# Patient Record
Sex: Female | Born: 1985
Health system: Southern US, Community
[De-identification: ages and names within clinical notes are randomized; demographics above are authoritative.]

## PROBLEM LIST (undated history)

## (undated) DIAGNOSIS — K219 Gastro-esophageal reflux disease without esophagitis: Secondary | ICD-10-CM

## (undated) DIAGNOSIS — I10 Essential (primary) hypertension: Secondary | ICD-10-CM

## (undated) DIAGNOSIS — F419 Anxiety disorder, unspecified: Secondary | ICD-10-CM

---
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
## 2014-04-03 ENCOUNTER — Ambulatory Visit (INDEPENDENT_AMBULATORY_CARE_PROVIDER_SITE_OTHER): Payer: BC Managed Care – PPO | Admitting: Family

## 2014-04-03 ENCOUNTER — Encounter: Payer: Self-pay | Admitting: Family

## 2014-04-03 VITALS — BP 120/80 | HR 71 | Temp 98.8°F | Resp 16 | Ht 63.5 in | Wt 182.0 lb

## 2014-04-03 DIAGNOSIS — Z23 Encounter for immunization: Secondary | ICD-10-CM

## 2014-04-03 DIAGNOSIS — Z309 Encounter for contraceptive management, unspecified: Secondary | ICD-10-CM | POA: Insufficient documentation

## 2014-04-03 DIAGNOSIS — Z0000 Encounter for general adult medical examination without abnormal findings: Secondary | ICD-10-CM | POA: Insufficient documentation

## 2014-04-03 DIAGNOSIS — Z808 Family history of malignant neoplasm of other organs or systems: Secondary | ICD-10-CM

## 2014-04-03 LAB — POCT URINE HCG BY VISUAL COLOR COMPARISON TESTS: Preg Test, Ur: NEGATIVE

## 2014-04-03 MED ORDER — NORETHIN ACE-ETH ESTRAD-FE 1.5-30 MG-MCG PO TABS: 1.0000 | ORAL_TABLET | Freq: Every day | ORAL | Status: DC

## 2014-04-03 NOTE — Assessment & Plan Note (Signed)
Start microgestin, counseled pt on safe sex.

## 2014-04-03 NOTE — Patient Instructions (Signed)
Please complete lab work prior to leaving. Schedule pap smear at the front desk.

## 2014-04-03 NOTE — Progress Notes (Signed)
Subjective:    Patient ID: Gina Mcdowell, female    DOB: [birth year], 28 y.o.   MRN: [id number]  HPI [name] is a 28 year old female here to establish care today. LMP Mar 07, 2014  Immunizations: reports last tetanus was 2014 Diet:reports healthy diet Exercise: enjoys treadmill Pap Smear: 08/2012    Review of Systems  Constitutional: Negative for fever, chills, diaphoresis, appetite change, fatigue and unexpected weight change.       Good appetite. Eating more fruits and vegetables than meat lately. Body mass index is 31.73 kg/(m^2).   Respiratory: Negative for cough and shortness of breath.   Cardiovascular: Negative for chest pain, palpitations and leg swelling.  Gastrointestinal: Negative for diarrhea, constipation and blood in stool.  Musculoskeletal: Negative for myalgias.  Neurological: Positive for headaches. Negative for dizziness and seizures.       Has headaches 4 days out of a week. She believes this is related to her daily computer work. This has improved since she started wearing glasses. Takes asa which relieves the headache.  Hematological: Bruises/bleeds easily.  Psychiatric/Behavioral:       Denies depression or anxiety.   History reviewed. No pertinent past medical history.  History   Social History  . Marital Status: Single    Spouse Name: N/A    Number of Children: N/A  . Years of Education: N/A   Occupational History  . Not on file.   Social History Main Topics  . Smoking status: Never Smoker   . Smokeless tobacco: Never Used  . Alcohol Use: Yes     Comment: occasional  . Drug Use: No  . Sexual Activity: Yes    Birth Control/ Protection: Condom   Other Topics Concern  . Not on file   Social History Narrative   Works as Recruitment consultant at [employer], part time [employer]   Single   No children   No pets   Likes sleeping,speding time with family   She has 2 brothers and 2 sisters- all local.   Associates in medical billing/coding     History reviewed. No pertinent past surgical history.  Family History  Problem Relation Age of Onset  . Hypertension Mother   . Hyperlipidemia Father   . Hypertension Maternal Grandmother   . Diabetes Paternal Grandmother   . Cancer Paternal Grandmother     skin cancer melanoma  . Cancer Paternal Grandfather     prostate  . Cancer Other     colon and breast    No Known Allergies  No current outpatient prescriptions on file prior to visit.   No current facility-administered medications on file prior to visit.    BP 120/80  Pulse 71  Temp(Src) 98.8 F (37.1 C) (Oral)  Resp 16  Ht 5' 3.5" (1.613 m)  Wt 182 lb (82.555 kg)  BMI 31.73 kg/m2  SpO2 99%  LMP 03/07/2014       Objective:   Physical Exam  Constitutional: She is oriented to person, place, and time. She appears well-developed and well-nourished. No distress.  HENT:  Head: Normocephalic and atraumatic.  Mouth/Throat: No oropharyngeal exudate.  Eyes: Pupils are equal, round, and reactive to light. Right eye exhibits no discharge. Left eye exhibits no discharge. No scleral icterus.  Neck: Neck supple. No thyromegaly present.  Cardiovascular: Normal rate, regular rhythm, normal heart sounds and intact distal pulses.   No murmur heard. Pulmonary/Chest: Effort normal and breath sounds normal. No respiratory distress.  Abdominal: Soft.  Bowel sounds are normal. She exhibits no distension and no mass. There is no tenderness.  Musculoskeletal: She exhibits no edema.  Lymphadenopathy:    She has no cervical adenopathy.  Neurological: She is alert and oriented to person, place, and time.  Skin: Skin is warm and dry. She is not diaphoretic.  Psychiatric: She has a normal mood and affect. Her behavior is normal. Judgment and thought content normal.  Breast/pelvic deferred.         Assessment & Plan:  Patient seen by Adah Salvageawn Whitmire NP-student.  I have personally seen and examined patient and agree with Ms.  Whitmire's assessment and plan.

## 2014-04-03 NOTE — Assessment & Plan Note (Signed)
Discussed healthy diet, exercise, weight loss.  Obtain fasting labs.  Pt will return for pap/breast exam.

## 2014-04-03 NOTE — Progress Notes (Signed)
Pre visit review using our clinic review tool, if applicable. No additional management support is needed unless otherwise documented below in the visit note. 

## 2014-04-07 ENCOUNTER — Telehealth: Payer: Self-pay | Admitting: Family

## 2014-04-07 DIAGNOSIS — R809 Proteinuria, unspecified: Secondary | ICD-10-CM | POA: Insufficient documentation

## 2014-04-07 LAB — HEPATIC FUNCTION PANEL
ALK PHOS: 56 U/L (ref 39–117)
ALT: <8 U/L (ref 0–35)
AST: 14 U/L (ref 0–37)
Albumin: 3.7 g/dL (ref 3.5–5.2)
BILIRUBIN DIRECT: 0.1 mg/dL (ref 0.0–0.3)
BILIRUBIN INDIRECT: 0.1 mg/dL — AB (ref 0.2–1.2)
BILIRUBIN TOTAL: 0.2 mg/dL (ref 0.2–1.2)
Total Protein: 6.5 g/dL (ref 6.0–8.3)

## 2014-04-07 LAB — URINALYSIS, ROUTINE W REFLEX MICROSCOPIC
BILIRUBIN URINE: NEGATIVE
Glucose, UA: NEGATIVE mg/dL
KETONES UR: NEGATIVE mg/dL
Leukocytes, UA: NEGATIVE
NITRITE: NEGATIVE
PROTEIN: 30 mg/dL — AB
Specific Gravity, Urine: 1.03 (ref 1.005–1.030)
Urobilinogen, UA: 1 mg/dL (ref 0.0–1.0)
pH: 6 (ref 5.0–8.0)

## 2014-04-07 LAB — BASIC METABOLIC PANEL WITH GFR
BUN: 12 mg/dL (ref 6–23)
CHLORIDE: 103 meq/L (ref 96–112)
CO2: 26 meq/L (ref 19–32)
Calcium: 9 mg/dL (ref 8.4–10.5)
Creat: 0.81 mg/dL (ref 0.50–1.10)
GFR, Est African American: >89 mL/min
Glucose, Bld: 81 mg/dL (ref 70–99)
POTASSIUM: 4 meq/L (ref 3.5–5.3)
SODIUM: 137 meq/L (ref 135–145)

## 2014-04-07 LAB — CBC WITH DIFFERENTIAL/PLATELET
BASOS PCT: 0 % (ref 0–1)
Basophils Absolute: 0 10*3/uL (ref 0.0–0.1)
EOS PCT: 2 % (ref 0–5)
Eosinophils Absolute: 0.2 10*3/uL (ref 0.0–0.7)
HEMATOCRIT: 40.2 % (ref 36.0–46.0)
Hemoglobin: 13.5 g/dL (ref 12.0–15.0)
LYMPHS PCT: 48 % — AB (ref 12–46)
Lymphs Abs: 4.7 10*3/uL — ABNORMAL HIGH (ref 0.7–4.0)
MCH: 27.3 pg (ref 26.0–34.0)
MCHC: 33.6 g/dL (ref 30.0–36.0)
MCV: 81.4 fL (ref 78.0–100.0)
MONO ABS: 1.1 10*3/uL — AB (ref 0.1–1.0)
Monocytes Relative: 11 % (ref 3–12)
NEUTROS ABS: 3.8 10*3/uL (ref 1.7–7.7)
Neutrophils Relative %: 39 % — ABNORMAL LOW (ref 43–77)
PLATELETS: 341 10*3/uL (ref 150–400)
RBC: 4.94 MIL/uL (ref 3.87–5.11)
RDW: 14.2 % (ref 11.5–15.5)
WBC: 9.7 10*3/uL (ref 4.0–10.5)

## 2014-04-07 LAB — URINALYSIS, MICROSCOPIC ONLY
BACTERIA UA: NONE SEEN
Casts: NONE SEEN
Crystals: NONE SEEN

## 2014-04-07 LAB — LIPID PANEL
Cholesterol: 143 mg/dL (ref 0–200)
HDL: 38 mg/dL — AB (ref >39)
LDL Cholesterol: 83 mg/dL (ref 0–99)
Total CHOL/HDL Ratio: 3.8 Ratio
Triglycerides: 109 mg/dL (ref <150)
VLDL: 22 mg/dL (ref 0–40)

## 2014-04-07 LAB — TSH: TSH: 1.55 u[IU]/mL (ref 0.350–4.500)

## 2014-04-07 NOTE — Telephone Encounter (Signed)
Please contact pt and let her know that her urine has some protein in it.  I would like her to return to the lab in 2 weeks to repeat urinalysis. Blood count, liver, sugar, cholesterol all good. Except HDL "good cholesterol" could be higher.  Exercise can help raise HDL.

## 2014-04-07 NOTE — Telephone Encounter (Signed)
Notified pt and she voices understanding. Lab order entered. 

## 2014-04-29 LAB — URINALYSIS, ROUTINE W REFLEX MICROSCOPIC
Bilirubin Urine: NEGATIVE
Glucose, UA: NEGATIVE mg/dL
Hgb urine dipstick: NEGATIVE
Ketones, ur: NEGATIVE mg/dL
Leukocytes, UA: NEGATIVE
NITRITE: NEGATIVE
PROTEIN: NEGATIVE mg/dL
SPECIFIC GRAVITY, URINE: 1.014 (ref 1.005–1.030)
UROBILINOGEN UA: 1 mg/dL (ref 0.0–1.0)
pH: 6.5 (ref 5.0–8.0)

## 2014-06-12 ENCOUNTER — Other Ambulatory Visit (HOSPITAL_COMMUNITY)
Admission: RE | Admit: 2014-06-12 | Discharge: 2014-06-12 | Disposition: A | Discharge status: Home / Self Care | Payer: BC Managed Care – PPO | Source: Ambulatory Visit | Attending: Family | Admitting: Family

## 2014-06-12 ENCOUNTER — Encounter: Payer: Self-pay | Admitting: Family

## 2014-06-12 ENCOUNTER — Ambulatory Visit (INDEPENDENT_AMBULATORY_CARE_PROVIDER_SITE_OTHER): Payer: BC Managed Care – PPO | Admitting: Family

## 2014-06-12 VITALS — BP 122/86 | HR 65 | Temp 98.4°F | Resp 16 | Ht 63.5 in | Wt 179.0 lb

## 2014-06-12 DIAGNOSIS — Z01419 Encounter for gynecological examination (general) (routine) without abnormal findings: Secondary | ICD-10-CM

## 2014-06-12 DIAGNOSIS — Z309 Encounter for contraceptive management, unspecified: Secondary | ICD-10-CM

## 2014-06-12 DIAGNOSIS — N760 Acute vaginitis: Secondary | ICD-10-CM | POA: Insufficient documentation

## 2014-06-12 DIAGNOSIS — Z1151 Encounter for screening for human papillomavirus (HPV): Secondary | ICD-10-CM | POA: Diagnosis present

## 2014-06-12 DIAGNOSIS — Z113 Encounter for screening for infections with a predominantly sexual mode of transmission: Secondary | ICD-10-CM | POA: Insufficient documentation

## 2014-06-12 NOTE — Patient Instructions (Addendum)
We will contact you with your pap smear results. Follow up in 1 year.

## 2014-06-12 NOTE — Progress Notes (Signed)
Subjective:    Patient ID: Gina Mcdowell, female    DOB: [birth year], 28 y.o.   MRN: [id number]  HPI  Ms. Gina Mcdowell is a 28 yr old female who presents today for pap smear.  Patient's last menstrual period was:  2 weeks ago.  Sexually active: 1 partner last 6 months The current method of family planning is: ocp- recently started ocp. Notes that she has had some intermittent spotting.  Exercising:  Running steps regularly Smoker: non-smoker Pap: 2013, normal History of abnormal Pap: abnormal pap 2011 had biopsy  Review of Systems See HPI  No past medical history on file.  History   Social History  . Marital Status: Single    Spouse Name: N/A    Number of Children: N/A  . Years of Education: N/A   Occupational History  . Not on file.   Social History Main Topics  . Smoking status: Never Smoker   . Smokeless tobacco: Never Used  . Alcohol Use: Yes     Comment: occasional  . Drug Use: No  . Sexual Activity: Yes    Birth Control/ Protection: Condom   Other Topics Concern  . Not on file   Social History Narrative   Works as Recruitment consultant at [employer], part time [employer]   Single   No children   No pets   Likes sleeping,speding time with family   She has 2 brothers and 2 sisters- all local.   Associates in medical billing/coding    No past surgical history on file.  Family History  Problem Relation Age of Onset  . Hypertension Mother   . Hyperlipidemia Father   . Hypertension Maternal Grandmother   . Diabetes Paternal Grandmother   . Cancer Paternal Grandmother     skin cancer melanoma  . Cancer Paternal Grandfather     prostate  . Cancer Other     colon and breast    No Known Allergies  Current Outpatient Prescriptions on File Prior to Visit  Medication Sig Dispense Refill  . norethindrone-ethinyl estradiol-iron (MICROGESTIN FE 1.5/30) 1.5-30 MG-MCG tablet Take 1 tablet by mouth daily.  1 Package  5   No current facility-administered  medications on file prior to visit.    BP 122/86  Pulse 65  Temp(Src) 98.4 F (36.9 C) (Oral)  Resp 16  Ht 5' 3.5" (1.613 m)  Wt 179 lb (81.194 kg)  BMI 31.21 kg/m2  SpO2 98%  LMP 06/05/2014       Objective:   Physical Exam  Constitutional: She is oriented to person, place, and time. She appears well-developed and well-nourished. No distress.  Cardiovascular: Normal rate and regular rhythm.   No murmur heard. Pulmonary/Chest: Effort normal and breath sounds normal. No respiratory distress. She has no wheezes. She has no rales. She exhibits no tenderness.  Neurological: She is alert and oriented to person, place, and time.  Skin: Skin is warm and dry.  Psychiatric: She has a normal mood and affect. Her behavior is normal. Judgment and thought content normal.  Breasts: Examined lying and sitting.  Right: Without masses, retractions, discharge or axillary adenopathy.  Left: Without masses, retractions, discharge or axillary adenopathy.  Inguinal/mons: Normal without inguinal adenopathy  External genitalia: Normal  BUS/Urethra/Skene's glands: Normal  Bladder: Normal  Vagina: some thick white vaginal discharge is noted. Cervix: Normal  Uterus: normal in size, shape and contour. Midline and mobile  Adnexa/parametria:  Rt: Without masses or tenderness.  Lt: Without masses or tenderness.  Anus and perineum: Normal          Assessment & Plan:

## 2014-06-12 NOTE — Progress Notes (Signed)
Pre visit review using our clinic review tool, if applicable. No additional management support is needed unless otherwise documented below in the visit note. 

## 2014-06-14 ENCOUNTER — Telehealth: Payer: Self-pay | Admitting: Family

## 2014-06-14 LAB — CYTOLOGY - PAP

## 2014-06-14 MED ORDER — FLUCONAZOLE 150 MG PO TABS: ORAL_TABLET | ORAL | Status: DC

## 2014-06-14 NOTE — Telephone Encounter (Signed)
Please contact pt and let her know pap is negative.  Gonorrhea/chlamydia and HPV testing negative.  Did note yeast infection. rx sent for diflucan.

## 2014-06-15 DIAGNOSIS — Z01419 Encounter for gynecological examination (general) (routine) without abnormal findings: Secondary | ICD-10-CM | POA: Insufficient documentation

## 2014-06-15 NOTE — Telephone Encounter (Signed)
Spoke with pt, advised lab results. Pt understood. 

## 2014-06-15 NOTE — Assessment & Plan Note (Signed)
Continue ocp, discussed that in time, spotting should resolve, but to let me know if not improved in 3 months.

## 2014-06-15 NOTE — Assessment & Plan Note (Signed)
Pap performed. Discussed breast self exam.

## 2014-09-14 ENCOUNTER — Telehealth: Payer: Self-pay | Admitting: *Deleted

## 2014-09-14 MED ORDER — NORETHIN ACE-ETH ESTRAD-FE 1.5-30 MG-MCG PO TABS: 1.0000 | ORAL_TABLET | Freq: Every day | ORAL | Status: DC

## 2014-09-14 NOTE — Telephone Encounter (Signed)
Received fax from [facility] for Gildess FE 1.5/30, 1 tablet daily. Pt due for annual exam 06/2014.  Refills sent.

## 2014-09-15 ENCOUNTER — Other Ambulatory Visit: Payer: Self-pay | Admitting: Family

## 2014-09-15 NOTE — Telephone Encounter (Signed)
Received refill request from [facility] for microgestin. Refill sent yesterday to [facility]. Verified with pt today that she has enough to get her through until mail order arrives and if not she will let me know. Rx denied at [facility].

## 2014-12-08 ENCOUNTER — Telehealth: Payer: Self-pay | Admitting: *Deleted

## 2014-12-08 MED ORDER — NORETHIN ACE-ETH ESTRAD-FE 1.5-30 MG-MCG PO TABS: 1.0000 | ORAL_TABLET | Freq: Every day | ORAL | Status: DC

## 2014-12-08 NOTE — Telephone Encounter (Signed)
Received [facility] failure notification as "med not found". Spoke with pharmacy, pt is requesting refill of birth control to local pharmacy. Spoke with pt, she is requesting 30 day supply until she can order via [facility] mail order. 30 day supply sent to [facility]; pt aware.

## 2015-03-16 ENCOUNTER — Telehealth: Payer: Self-pay | Admitting: *Deleted

## 2015-03-16 MED ORDER — NORETHIN ACE-ETH ESTRAD-FE 1.5-30 MG-MCG PO TABS: 1.0000 | ORAL_TABLET | Freq: Every day | ORAL | Status: DC

## 2015-03-16 NOTE — Telephone Encounter (Signed)
Received refill request from [facility]. Refills sent. Pt due for CPE 06/13/15 or after.

## 2015-09-30 ENCOUNTER — Other Ambulatory Visit: Payer: Self-pay | Admitting: Family

## 2015-10-02 NOTE — Telephone Encounter (Signed)
Birth control refill sent to pharmacy. Pt is past due for fasting physical. Please call pt to arrange CPE with Melissa soon.  Thanks!

## 2015-11-23 ENCOUNTER — Encounter: Payer: Self-pay | Admitting: Family

## 2015-11-23 ENCOUNTER — Ambulatory Visit (INDEPENDENT_AMBULATORY_CARE_PROVIDER_SITE_OTHER): Payer: BLUE CROSS/BLUE SHIELD | Admitting: Family

## 2015-11-23 VITALS — BP 119/79 | HR 77 | Temp 98.6°F | Resp 16 | Ht 64.0 in | Wt 172.2 lb

## 2015-11-23 DIAGNOSIS — Z0000 Encounter for general adult medical examination without abnormal findings: Secondary | ICD-10-CM | POA: Diagnosis not present

## 2015-11-23 LAB — URINALYSIS, ROUTINE W REFLEX MICROSCOPIC
Bilirubin Urine: NEGATIVE
HGB URINE DIPSTICK: NEGATIVE
Ketones, ur: NEGATIVE
LEUKOCYTES UA: NEGATIVE
NITRITE: NEGATIVE
SPECIFIC GRAVITY, URINE: 1.015 (ref 1.000–1.030)
TOTAL PROTEIN, URINE-UPE24: NEGATIVE
URINE GLUCOSE: NEGATIVE
UROBILINOGEN UA: 0.2 (ref 0.0–1.0)
pH: 6.5 (ref 5.0–8.0)

## 2015-11-23 LAB — CBC WITH DIFFERENTIAL/PLATELET
Basophils Absolute: 0 10*3/uL (ref 0.0–0.1)
Basophils Relative: 0.5 % (ref 0.0–3.0)
EOS PCT: 1.6 % (ref 0.0–5.0)
Eosinophils Absolute: 0.1 10*3/uL (ref 0.0–0.7)
HCT: 41.7 % (ref 36.0–46.0)
Hemoglobin: 13.7 g/dL (ref 12.0–15.0)
LYMPHS ABS: 3.1 10*3/uL (ref 0.7–4.0)
Lymphocytes Relative: 37.4 % (ref 12.0–46.0)
MCHC: 32.8 g/dL (ref 30.0–36.0)
MCV: 81.7 fl (ref 78.0–100.0)
MONO ABS: 0.7 10*3/uL (ref 0.1–1.0)
MONOS PCT: 8 % (ref 3.0–12.0)
NEUTROS ABS: 4.4 10*3/uL (ref 1.4–7.7)
NEUTROS PCT: 52.5 % (ref 43.0–77.0)
Platelets: 334 10*3/uL (ref 150.0–400.0)
RBC: 5.1 Mil/uL (ref 3.87–5.11)
RDW: 13.7 % (ref 11.5–15.5)
WBC: 8.3 10*3/uL (ref 4.0–10.5)

## 2015-11-23 LAB — HEPATIC FUNCTION PANEL
ALBUMIN: 3.8 g/dL (ref 3.5–5.2)
ALK PHOS: 46 U/L (ref 39–117)
ALT: 6 U/L (ref 0–35)
AST: 15 U/L (ref 0–37)
BILIRUBIN DIRECT: 0 mg/dL (ref 0.0–0.3)
TOTAL PROTEIN: 7 g/dL (ref 6.0–8.3)
Total Bilirubin: 0.3 mg/dL (ref 0.2–1.2)

## 2015-11-23 LAB — BASIC METABOLIC PANEL
BUN: 11 mg/dL (ref 6–23)
CALCIUM: 9.1 mg/dL (ref 8.4–10.5)
CO2: 25 meq/L (ref 19–32)
Chloride: 103 mEq/L (ref 96–112)
Creatinine, Ser: 0.92 mg/dL (ref 0.40–1.20)
GFR: 93.42 mL/min (ref >60.00)
Glucose, Bld: 80 mg/dL (ref 70–99)
Potassium: 3.7 mEq/L (ref 3.5–5.1)
Sodium: 137 mEq/L (ref 135–145)

## 2015-11-23 LAB — LIPID PANEL
CHOL/HDL RATIO: 4
Cholesterol: 178 mg/dL (ref 0–200)
HDL: 44.7 mg/dL (ref >39.00)
LDL Cholesterol: 112 mg/dL — ABNORMAL HIGH (ref 0–99)
NONHDL: 132.87
Triglycerides: 104 mg/dL (ref 0.0–149.0)
VLDL: 20.8 mg/dL (ref 0.0–40.0)

## 2015-11-23 LAB — TSH: TSH: 1.84 u[IU]/mL (ref 0.35–4.50)

## 2015-11-23 MED ORDER — NORETHIN ACE-ETH ESTRAD-FE 1.5-30 MG-MCG PO TABS: 1.0000 | ORAL_TABLET | Freq: Every day | ORAL | Status: DC

## 2015-11-23 NOTE — Progress Notes (Signed)
Subjective:    Patient ID: Gina Mcdowell, female    DOB: February 27, 1986, 30 y.o.   MRN: [id number]  HPI  Ms. Gina Mcdowell is a 30 yr old female who presents today for cpx.  Patient presents today for complete physical.  Immunizations: tetanus 2015, declines flu shot Diet:reports healthy diet  Exercise: 2-3 times a week Wt Readings from Last 3 Encounters:  11/23/15 172 lb 3.2 oz (78.109 kg)  06/12/14 179 lb (81.194 kg)  04/03/14 182 lb (82.555 kg)  Pap Smear:  2015- normal Vision- 2 years ago- will schedule Dental:  2 years ago- will schedule  Review of Systems  Constitutional: Negative for unexpected weight change.  HENT: Negative for hearing loss and rhinorrhea.   Eyes: Negative for visual disturbance.  Respiratory: Negative for cough and shortness of breath.   Cardiovascular: Negative for chest pain and leg swelling.  Gastrointestinal: Negative for nausea, vomiting and diarrhea.  Genitourinary: Negative for dysuria and frequency.       Some mid cycle spottting on ocp  Musculoskeletal: Negative for myalgias and arthralgias.  Skin: Negative for rash.  Neurological:       + HA's 1 x every 2 weeks- resolves with aleve  Hematological: Negative for adenopathy.  Psychiatric/Behavioral:       Denies depression/anxiety   History reviewed. No pertinent past medical history.  Social History   Social History  . Marital Status: Single    Spouse Name: N/A  . Number of Children: N/A  . Years of Education: N/A   Occupational History  . Not on file.   Social History Main Topics  . Smoking status: Never Smoker   . Smokeless tobacco: Never Used  . Alcohol Use: 1.2 - 1.8 oz/week    2-3 Glasses of wine per week  . Drug Use: No  . Sexual Activity: Yes    Birth Control/ Protection: Condom   Other Topics Concern  . Not on file   Social History Narrative   Works as Recruitment consultant at [employer], part time [employer]   Single   No children   No pets   Likes sleeping,speding  time with family   She has 2 brothers and 2 sisters- all local.   Associates in medical billing/coding    History reviewed. No pertinent past surgical history.  Family History  Problem Relation Age of Onset  . Hypertension Mother   . Hyperlipidemia Father   . Hypertension Maternal Grandmother   . Diabetes Paternal Grandmother   . Cancer Paternal Grandmother     skin cancer melanoma  . Cancer Paternal Grandfather     prostate  . Cancer Other     colon and breast    No Known Allergies  Current Outpatient Prescriptions on File Prior to Visit  Medication Sig Dispense Refill  . BLISOVI FE 1.5/30 1.5-30 MG-MCG tablet TAKE 1 TABLET DAILY 84 tablet 0   No current facility-administered medications on file prior to visit.    Pulse 77  Temp(Src) 98.6 F (37 C) (Oral)  Resp 16  Ht  (1.626 m)  Wt 172 lb 3.2 oz (78.109 kg)  BMI 29.54 kg/m2  SpO2 100%  LMP 10/26/2015        Objective:   Physical Exam  Physical Exam  Constitutional: She is oriented to person, place, and time. She appears well-developed and well-nourished. No distress.  HENT:  Head: Normocephalic and atraumatic.  Right Ear: Tympanic membrane and ear canal normal.  Left Ear: Tympanic membrane  and ear canal normal.  Mouth/Throat: Oropharynx is clear and moist.  Eyes: Pupils are equal, round, and reactive to light. No scleral icterus.  Neck: Normal range of motion. No thyromegaly present.  Cardiovascular: Normal rate and regular rhythm.   No murmur heard. Pulmonary/Chest: Effort normal and breath sounds normal. No respiratory distress. He has no wheezes. She has no rales. She exhibits no tenderness.  Abdominal: Soft. Bowel sounds are normal. He exhibits no distension and no mass. There is no tenderness. There is no rebound and no guarding.  Musculoskeletal: She exhibits no edema.  Lymphadenopathy:    She has no cervical adenopathy.  Neurological: She is alert and oriented to person, place, and time.  She has normal patellar reflexes. She exhibits normal muscle tone. Coordination normal.  Skin: Skin is warm and dry.  Psychiatric: She has a normal mood and affect. Her behavior is normal. Judgment and thought content normal.  Breasts: Examined lying Right: Without masses, retractions, discharge or axillary adenopathy.  Left: Without masses, retractions, discharge or axillary adenopathy.  Pelvis:  Deferred           Assessment & Plan:         Assessment & Plan:

## 2015-11-23 NOTE — Patient Instructions (Signed)
Please complete lab work prior to leaving. Continue your work with healthy diet, exercise and weight loss. Follow up in 1 year, sooner if problems/concerns.

## 2015-11-23 NOTE — Progress Notes (Signed)
Pre visit review using our clinic review tool, if applicable. No additional management support is needed unless otherwise documented below in the visit note. 

## 2015-11-23 NOTE — Assessment & Plan Note (Signed)
I commended her on her exercise and weight loss. Obtain routine labs.  Declines flu shot.  Tdap up to date.

## 2016-11-10 ENCOUNTER — Other Ambulatory Visit: Payer: Self-pay | Admitting: Family

## 2016-11-10 NOTE — Telephone Encounter (Signed)
Due for CPE

## 2018-07-25 ENCOUNTER — Emergency Department (HOSPITAL_COMMUNITY)
Admission: EM | Admit: 2018-07-25 | Discharge: 2018-07-25 | Disposition: A | Discharge status: Home / Self Care | Payer: BLUE CROSS/BLUE SHIELD | Attending: Emergency Medicine | Admitting: Emergency Medicine

## 2018-07-25 ENCOUNTER — Encounter (HOSPITAL_COMMUNITY): Payer: Self-pay | Admitting: Emergency Medicine

## 2018-07-25 ENCOUNTER — Emergency Department (HOSPITAL_COMMUNITY): Payer: BLUE CROSS/BLUE SHIELD

## 2018-07-25 ENCOUNTER — Other Ambulatory Visit: Payer: Self-pay

## 2018-07-25 DIAGNOSIS — Z79899 Other long term (current) drug therapy: Secondary | ICD-10-CM | POA: Diagnosis not present

## 2018-07-25 DIAGNOSIS — K219 Gastro-esophageal reflux disease without esophagitis: Secondary | ICD-10-CM | POA: Insufficient documentation

## 2018-07-25 DIAGNOSIS — R1013 Epigastric pain: Secondary | ICD-10-CM | POA: Diagnosis present

## 2018-07-25 LAB — I-STAT BETA HCG BLOOD, ED (MC, WL, AP ONLY): I-stat hCG, quantitative: <5 m[IU]/mL (ref <5)

## 2018-07-25 LAB — COMPREHENSIVE METABOLIC PANEL
ALT: 7 U/L (ref 0–44)
AST: 18 U/L (ref 15–41)
Albumin: 3.9 g/dL (ref 3.5–5.0)
Alkaline Phosphatase: 58 U/L (ref 38–126)
Anion gap: 13 (ref 5–15)
BUN: 9 mg/dL (ref 6–20)
CO2: 22 mmol/L (ref 22–32)
Calcium: 9 mg/dL (ref 8.9–10.3)
Chloride: 103 mmol/L (ref 98–111)
Creatinine, Ser: 0.93 mg/dL (ref 0.44–1.00)
GFR calc Af Amer: >60 mL/min (ref >60)
GFR calc non Af Amer: >60 mL/min (ref >60)
Glucose, Bld: 100 mg/dL — ABNORMAL HIGH (ref 70–99)
Potassium: 3.2 mmol/L — ABNORMAL LOW (ref 3.5–5.1)
Sodium: 138 mmol/L (ref 135–145)
Total Bilirubin: 1.2 mg/dL (ref 0.3–1.2)
Total Protein: 7.4 g/dL (ref 6.5–8.1)

## 2018-07-25 LAB — CBC
HCT: 44.9 % (ref 36.0–46.0)
HEMOGLOBIN: 15.1 g/dL — AB (ref 12.0–15.0)
MCH: 28.3 pg (ref 26.0–34.0)
MCHC: 33.6 g/dL (ref 30.0–36.0)
MCV: 84.1 fL (ref 78.0–100.0)
Platelets: 360 10*3/uL (ref 150–400)
RBC: 5.34 MIL/uL — AB (ref 3.87–5.11)
RDW: 12.3 % (ref 11.5–15.5)
WBC: 7.6 10*3/uL (ref 4.0–10.5)

## 2018-07-25 LAB — I-STAT TROPONIN, ED: Troponin i, poc: 0 ng/mL (ref 0.00–0.08)

## 2018-07-25 LAB — LIPASE, BLOOD: Lipase: 34 U/L (ref 11–51)

## 2018-07-25 MED ORDER — SUCRALFATE 1 GM/10ML PO SUSP: 1.0000 g | Freq: Three times a day (TID) | ORAL | 0 refills | Status: DC

## 2018-07-25 MED ORDER — GI COCKTAIL ~~LOC~~
30.0000 mL | Freq: Once | ORAL | Status: AC
  Administered 2018-07-25: 30 mL via ORAL
  Filled 2018-07-25: qty 30

## 2018-07-25 MED ORDER — SUCRALFATE 1 G PO TABS
1.0000 g | ORAL_TABLET | Freq: Once | ORAL | Status: AC
  Administered 2018-07-25: 1 g via ORAL
  Filled 2018-07-25: qty 1

## 2018-07-25 MED ORDER — FAMOTIDINE IN NACL 20-0.9 MG/50ML-% IV SOLN
20.0000 mg | Freq: Once | INTRAVENOUS | Status: AC
  Administered 2018-07-25: 20 mg via INTRAVENOUS
  Filled 2018-07-25: qty 50

## 2018-07-25 MED ORDER — POTASSIUM CHLORIDE CRYS ER 20 MEQ PO TBCR
40.0000 meq | EXTENDED_RELEASE_TABLET | Freq: Once | ORAL | Status: AC
  Administered 2018-07-25: 40 meq via ORAL
  Filled 2018-07-25: qty 2

## 2018-07-25 NOTE — Discharge Instructions (Signed)
Continue taking your Zantac and Prilosec at home as prescribed.  We will add Carafate 4 times daily as prescribed.  Please read the information about food choices for GERD below.  Please follow-up with your primary care provider or gastroenterologist for further management of your refractory GERD.  Please return the emergency department if you develop any new or worsening symptoms.

## 2018-07-25 NOTE — ED Triage Notes (Signed)
Pt. Stated, Ive had acid reflux all the time, but Lavenia Atlasve had it for 4 days and Ive tasken all my acid reflux medications and I still have it.

## 2018-07-25 NOTE — ED Provider Notes (Signed)
[facility] Note   CSN: [id number] Arrival date & time: 07/25/18  [id number]     History   Chief Complaint Chief Complaint  Patient presents with  . Gastroesophageal Reflux  . Chest Pain    HPI Gina Mcdowell is a 32 y.o. female with history of GERD who presents with constant epigastric and lower chest burning for the past 4 days.  She reports it feels like typical acid reflux symptoms.  Her pain has been constant for the past 4 days.  She reports some associated nausea, but no vomiting.  Her pain does not radiate.  She denies any shortness of breath, other abdominal pain.  She reports beer better help her belch.  She reports she has been eating poorly over the past 2 weeks.  She has been taking Zantac and Prilosec at home without relief.  She gets these medications over-the-counter and has not seen her primary care doctor for this problem, although she has had symptoms for the past 4 years related to it.  She denies any recent long trips, surgeries, known cancer, new leg pain or swelling, history of blood clots.  HPI  History reviewed. No pertinent past medical history.  Patient Active Problem List   Diagnosis Date Noted  . Encounter for routine gynecological examination 06/15/2014  . Well woman exam with routine gynecological exam 06/12/2014  . Proteinuria 04/07/2014  . Preventative health care 04/03/2014  . Unspecified contraceptive management 04/03/2014    History reviewed. No pertinent surgical history.   OB History   None      Home Medications    Prior to Admission medications   Medication Sig Start Date End Date Taking? Authorizing Provider  ibuprofen (ADVIL,MOTRIN) 200 MG tablet Take 200 mg by mouth every 6 (six) hours as needed for cramping.   Yes [provider]  naproxen sodium (ALEVE) 220 MG tablet Take 220 mg by mouth daily as needed (pain).   Yes [provider]  omeprazole (PRILOSEC OTC) 20 MG  tablet Take 20 mg by mouth daily as needed (acid reflux).   Yes [provider]  ranitidine (ZANTAC) 150 MG tablet Take 150 mg by mouth 2 (two) times daily as needed for heartburn.    Yes [provider]  BLISOVI FE 1.5/30 1.5-30 MG-MCG tablet TAKE 1 TABLET DAILY Patient not taking: No sig reported 11/10/16   Sandford Craze'Sullivan, Melissa, NP  sucralfate (CARAFATE) 1 GM/10ML suspension Take 10 mLs (1 g total) by mouth 4 (four) times daily -  with meals and at bedtime. 07/25/18   Emi HolesLaw, Dauna Ziska M, PA-C    Family History Family History  Problem Relation Age of Onset  . Hypertension Mother   . Hyperlipidemia Father   . Hypertension Maternal Grandmother   . Diabetes Paternal Grandmother   . Cancer Paternal Grandmother        skin cancer melanoma  . Cancer Paternal Grandfather        prostate  . Cancer Other        colon and breast    Social History Social History   Tobacco Use  . Smoking status: Never Smoker  . Smokeless tobacco: Never Used  Substance Use Topics  . Alcohol use: Yes    Alcohol/week: 2.0 - 3.0 standard drinks    Types: 2 - 3 Glasses of wine per week  . Drug use: No     Allergies   Patient has no known allergies.   Review of Systems Review  of Systems  Constitutional: Negative for chills and fever.  HENT: Negative for facial swelling and sore throat.   Respiratory: Negative for shortness of breath.   Cardiovascular: Positive for chest pain.  Gastrointestinal: Positive for abdominal pain and nausea. Negative for vomiting.  Genitourinary: Negative for dysuria.  Musculoskeletal: Negative for back pain.  Skin: Negative for rash and wound.  Neurological: Negative for headaches.  Psychiatric/Behavioral: The patient is not nervous/anxious.      Physical Exam Updated Vital Signs BP (!) 139/96   Pulse 84   Temp 99 F (37.2 C) (Oral)   Resp 15   Ht 5\' 2"  (1.575 m)   Wt 74.8 kg   LMP 07/24/2018   SpO2 98%   BMI 30.18 kg/m   Physical Exam    Constitutional: She appears well-developed and well-nourished. No distress.  HENT:  Head: Normocephalic and atraumatic.  Mouth/Throat: Oropharynx is clear and moist. No oropharyngeal exudate.  Eyes: Pupils are equal, round, and reactive to light. Conjunctivae are normal. Right eye exhibits no discharge. Left eye exhibits no discharge. No scleral icterus.  Neck: Normal range of motion. Neck supple. No thyromegaly present.  Cardiovascular: Normal rate, regular rhythm, normal heart sounds and intact distal pulses. Exam reveals no gallop and no friction rub.  No murmur heard. Pulmonary/Chest: Effort normal and breath sounds normal. No stridor. No respiratory distress. She has no wheezes. She has no rales. She exhibits no tenderness.  Abdominal: Soft. Bowel sounds are normal. She exhibits no distension. There is tenderness (reproduces burning) in the epigastric area. There is no rebound and no guarding.  Musculoskeletal: She exhibits no edema.  Lymphadenopathy:    She has no cervical adenopathy.  Neurological: She is alert. Coordination normal.  Skin: Skin is warm and dry. No rash noted. She is not diaphoretic. No pallor.  Psychiatric: She has a normal mood and affect.  Nursing note and vitals reviewed.    ED Treatments / Results  Labs (all labs ordered are listed, but only abnormal results are displayed) Labs Reviewed  CBC - Abnormal; Notable for the following components:      Result Value   RBC 5.34 (*)    Hemoglobin 15.1 (*)    All other components within normal limits  COMPREHENSIVE METABOLIC PANEL - Abnormal; Notable for the following components:   Potassium 3.2 (*)    Glucose, Bld 100 (*)    All other components within normal limits  LIPASE, BLOOD  I-STAT TROPONIN, ED  I-STAT BETA HCG BLOOD, ED ([facility], [facility], [facility] ONLY)    EKG EKG Interpretation  Date/Time:  Sunday July 25 2018 07:52:48 EDT Ventricular Rate:  95 PR Interval:  144 QRS Duration: 86 QT Interval:  378 QTC  Calculation: 475 R Axis:   61 Text Interpretation:  Normal sinus rhythm Cannot rule out Anteroseptal infarct , age undetermined Abnormal ECG No old tracing to compare Confirmed by Linwood Dibbles [phone]) on 07/25/2018 7:57:35 AM Also confirmed by Linwood Dibbles [phone]), editor Sheppard Evens ([id number])  on 07/25/2018 9:30:12 AM   Radiology Dg Chest 2 View  Result Date: 07/25/2018 CLINICAL DATA:  Chest pains, shortness of breath EXAM: CHEST - 2 VIEW COMPARISON:  None. FINDINGS: Lungs are clear.  No pleural effusion or pneumothorax. The heart is normal in size. Visualized osseous structures are within normal limits. IMPRESSION: Normal chest radiographs. Electronically Signed   By: Charline Bills M.D.   On: 07/25/2018 09:17    Procedures Procedures (including critical care time)  Medications Ordered in ED  Medications  famotidine (PEPCID) IVPB 20 mg premix (0 mg Intravenous Stopped 07/25/18 0838)  gi cocktail (Maalox,Lidocaine,Donnatal) (30 mLs Oral Given 07/25/18 0817)  sucralfate (CARAFATE) tablet 1 g (1 g Oral Given 07/25/18 0817)  potassium chloride SA (K-DUR,KLOR-CON) CR tablet 40 mEq (40 mEq Oral Given 07/25/18 1610)     Initial Impression / Assessment and Plan / ED Course  I have reviewed the triage vital signs and the nursing notes.  Pertinent labs & imaging results that were available during my care of the patient were reviewed by me and considered in my medical decision making (see chart for details).     Patient with refractory GERD symptoms.  Will continue Prilosec and Zantac, but add Carafate and advised to return to diet prior to the last few weeks, this patient had good control of her symptoms at that time.  Will refer to gastroenterology for further management.  Patient advised at least follow-up with PCP about this, as she has only been trying to treat over-the-counter for the past 4 years.  Labs are unremarkable except for mild hypokalemia which was replaced in the ED.  Patient  improved with GI cocktail, Pepcid, and Carafate in the ED.  Return precautions discussed.  Patient understands and agrees with plan.  Patient vitals stable throughout ED course and discharged in satisfactory condition.  Final Clinical Impressions(s) / ED Diagnoses   Final diagnoses:  Gastroesophageal reflux disease, esophagitis presence not specified    ED Discharge Orders         Ordered    sucralfate (CARAFATE) 1 GM/10ML suspension  3 times daily with meals & bedtime     07/25/18 0935           Emi Holes, PA-C 07/25/18 [id number]    Linwood Dibbles, MD 07/25/18 2009

## 2018-07-25 NOTE — ED Notes (Signed)
Patient transported to X-ray 

## 2018-07-28 DIAGNOSIS — K219 Gastro-esophageal reflux disease without esophagitis: Secondary | ICD-10-CM | POA: Insufficient documentation

## 2018-08-02 ENCOUNTER — Ambulatory Visit: Payer: BLUE CROSS/BLUE SHIELD | Admitting: Family

## 2018-09-06 ENCOUNTER — Emergency Department (HOSPITAL_COMMUNITY)
Admission: EM | Admit: 2018-09-06 | Discharge: 2018-09-07 | Disposition: A | Discharge status: Home / Self Care | Payer: BLUE CROSS/BLUE SHIELD | Attending: Emergency Medicine | Admitting: Emergency Medicine

## 2018-09-06 ENCOUNTER — Encounter (HOSPITAL_COMMUNITY): Payer: Self-pay | Admitting: Emergency Medicine

## 2018-09-06 ENCOUNTER — Emergency Department (HOSPITAL_COMMUNITY): Payer: BLUE CROSS/BLUE SHIELD

## 2018-09-06 DIAGNOSIS — Z79899 Other long term (current) drug therapy: Secondary | ICD-10-CM | POA: Insufficient documentation

## 2018-09-06 DIAGNOSIS — R079 Chest pain, unspecified: Secondary | ICD-10-CM | POA: Insufficient documentation

## 2018-09-06 LAB — CBC
HEMATOCRIT: 42.2 % (ref 36.0–46.0)
HEMOGLOBIN: 13.5 g/dL (ref 12.0–15.0)
MCH: 27 pg (ref 26.0–34.0)
MCHC: 32 g/dL (ref 30.0–36.0)
MCV: 84.4 fL (ref 80.0–100.0)
NRBC: 0 % (ref 0.0–0.2)
Platelets: 343 10*3/uL (ref 150–400)
RBC: 5 MIL/uL (ref 3.87–5.11)
RDW: 12 % (ref 11.5–15.5)
WBC: 10.4 10*3/uL (ref 4.0–10.5)

## 2018-09-06 LAB — I-STAT TROPONIN, ED: Troponin i, poc: 0 ng/mL (ref 0.00–0.08)

## 2018-09-06 LAB — BASIC METABOLIC PANEL
Anion gap: 7 (ref 5–15)
BUN: 6 mg/dL (ref 6–20)
CHLORIDE: 104 mmol/L (ref 98–111)
CO2: 24 mmol/L (ref 22–32)
Calcium: 8.9 mg/dL (ref 8.9–10.3)
Creatinine, Ser: 1.07 mg/dL — ABNORMAL HIGH (ref 0.44–1.00)
GFR calc non Af Amer: >60 mL/min (ref >60)
Glucose, Bld: 92 mg/dL (ref 70–99)
POTASSIUM: 3.3 mmol/L — AB (ref 3.5–5.1)
SODIUM: 135 mmol/L (ref 135–145)

## 2018-09-06 LAB — I-STAT BETA HCG BLOOD, ED (MC, WL, AP ONLY)

## 2018-09-06 NOTE — ED Notes (Signed)
Patient transported to X-ray 

## 2018-09-06 NOTE — ED Triage Notes (Signed)
Pt reports chest pain x1 month. Reports today it radiated to shoulder blades which prompted her visit.

## 2018-09-07 LAB — I-STAT TROPONIN, ED: TROPONIN I, POC: 0.01 ng/mL (ref 0.00–0.08)

## 2018-09-07 NOTE — Discharge Instructions (Signed)
Your work-up today looked great. Please follow-up with cardiology-- you can set up appt or have your primary care doctor do this for you. Copies of labs and x-ray on back. Return here for any new/acute changes.

## 2018-09-07 NOTE — ED Provider Notes (Signed)
[facility] EMERGENCY DEPARTMENT Provider Note   CSN: [id number] Arrival date & time: 09/06/18  2149     History   Chief Complaint Chief Complaint  Patient presents with  . Chest Pain    radiating to bilateral shoulders    HPI DAGNY FIORENTINO is a 32 y.o. female.  The history is provided by the patient and medical records.     32 year old female with history of acid reflux, presenting to the ED with chest pain.  States this has been ongoing intermittently for the past month.  States she had an endoscopy on Friday, 3 days ago as part of her ongoing evaluation for this.  States she was told endoscopy was overall normal but they did take some biopsies but she has not gotten these results back yet.  States over the past few days she has noticed some worsening pain.  States pain usually starts on her left side, will radiate to the right side with some associated lightheadedness and shortness of breath.  Usually this occurs in the evenings around 5-6 PM.  States often times it happens at night as well when she is lying in bed, worse if lying on her left side, better with lying on her right side.  States when she does lay on her left she tends to notice racing heartbeat as well.  She has not noticed this to go over 100 (has been monitoring on watch).  States today she had an episode where pain radiated into her shoulders and she became concerned.  States she has been seen for this about 4 times already and it started to get worried.  She did recently have thyroid testing with her primary care doctor which she was told was normal.  She denies any excessive caffeine intake, usually only drinks water or herbal tea.  She has no known cardiac history.  No significant family cardiac history.  No familial history of arrhythmia.  She is not a smoker.  Denies any illicit drug use.  She has not had any recent calf pain or swelling.  Not on exogenous estrogens.  No history of DVT or  PE.  History reviewed. No pertinent past medical history.  Patient Active Problem List   Diagnosis Date Noted  . Encounter for routine gynecological examination 06/15/2014  . Well woman exam with routine gynecological exam 06/12/2014  . Proteinuria 04/07/2014  . Preventative health care 04/03/2014  . Unspecified contraceptive management 04/03/2014    History reviewed. No pertinent surgical history.   OB History   None      Home Medications    Prior to Admission medications   Medication Sig Start Date End Date Taking? Authorizing Provider  pantoprazole (PROTONIX) 40 MG tablet Take 40 mg by mouth daily.   Yes [provider]  BLISOVI FE 1.5/30 1.5-30 MG-MCG tablet TAKE 1 TABLET DAILY Patient not taking: No sig reported 11/10/16   Sandford Craze, NP  sucralfate (CARAFATE) 1 GM/10ML suspension Take 10 mLs (1 g total) by mouth 4 (four) times daily -  with meals and at bedtime. Patient not taking: Reported on 09/07/2018 07/25/18   Emi Holes, PA-C    Family History Family History  Problem Relation Age of Onset  . Hypertension Mother   . Hyperlipidemia Father   . Hypertension Maternal Grandmother   . Diabetes Paternal Grandmother   . Cancer Paternal Grandmother        skin cancer melanoma  . Cancer Paternal Grandfather  prostate  . Cancer Other        colon and breast    Social History Social History   Tobacco Use  . Smoking status: Never Smoker  . Smokeless tobacco: Never Used  Substance Use Topics  . Alcohol use: Yes    Alcohol/week: 2.0 - 3.0 standard drinks    Types: 2 - 3 Glasses of wine per week  . Drug use: No     Allergies   Patient has no known allergies.   Review of Systems Review of Systems  Cardiovascular: Positive for chest pain.  All other systems reviewed and are negative.    Physical Exam Updated Vital Signs BP (!) 160/107 (BP Location: Right Arm)   Pulse 95   Temp 98.4 F (36.9 C) (Oral)   Resp 18   Ht 5'  2" (1.575 m)   Wt 73 kg   LMP 08/20/2018 (Exact Date)   SpO2 100%   BMI 29.45 kg/m   Physical Exam  Constitutional: She is oriented to person, place, and time. She appears well-developed and well-nourished.  HENT:  Head: Normocephalic and atraumatic.  Mouth/Throat: Oropharynx is clear and moist.  Eyes: Pupils are equal, round, and reactive to light. Conjunctivae and EOM are normal.  Neck: Normal range of motion.  Cardiovascular: Normal rate, regular rhythm and normal heart sounds.  Pulmonary/Chest: Effort normal and breath sounds normal. She has no decreased breath sounds. She has no wheezes. She has no rales.  Abdominal: Soft. Bowel sounds are normal.  Musculoskeletal: Normal range of motion.  Neurological: She is alert and oriented to person, place, and time.  Skin: Skin is warm and dry.  Psychiatric:  Seems a little nervous but not overly anxious  Nursing note and vitals reviewed.    ED Treatments / Results  Labs (all labs ordered are listed, but only abnormal results are displayed) Labs Reviewed  BASIC METABOLIC PANEL - Abnormal; Notable for the following components:      Result Value   Potassium 3.3 (*)    Creatinine, Ser 1.07 (*)    All other components within normal limits  CBC  I-STAT TROPONIN, ED  I-STAT BETA HCG BLOOD, ED ([facility], [facility], [facility] ONLY)  I-STAT TROPONIN, ED    EKG EKG Interpretation  Date/Time:  Monday September 06 2018 21:56:34 EST Ventricular Rate:  89 PR Interval:  144 QRS Duration: 82 QT Interval:  370 QTC Calculation: 450 R Axis:   68 Text Interpretation:  Normal sinus rhythm Septal infarct , age undetermined Abnormal ECG When compared with ECG of 07/25/2018, No significant change was found Confirmed by Dione Booze ([id number]) on 09/07/2018 2:48:14 AM   Radiology Dg Chest 2 View  Result Date: 09/06/2018 CLINICAL DATA:  32 y/o F; chest pain radiating to the mid back and left shoulder blade for 2 days. EXAM: CHEST - 2 VIEW COMPARISON:  07/25/2018  chest radiograph. FINDINGS: Stable heart size and mediastinal contours are within normal limits. Both lungs are clear. The visualized skeletal structures are unremarkable. IMPRESSION: No acute pulmonary process identified. Electronically Signed   By: Mitzi Hansen M.D.   On: 09/06/2018 22:20    Procedures Procedures (including critical care time)  Medications Ordered in ED Medications - No data to display   Initial Impression / Assessment and Plan / ED Course  I have reviewed the triage vital signs and the nursing notes.  Pertinent labs & imaging results that were available during my care of the patient were reviewed by me and considered  in my medical decision making (see chart for details).  32 year old female presenting to the ED for intermittent chest pain for the past month.  Noticed this mostly in the evening, worse with lying on left side, better with lying on right side.  Has had some associated feelings of racing heartbeat without true tachycardia.  She has been tracking her heart rate on her watch.  Also reports some intermittent lightheadedness.  She is afebrile and nontoxic.  Screening labs here are overall reassuring including troponin x2.  Chest x-ray is clear.  EKG without any acute findings.  Patient reports recent work-up with her PCP including thyroid studies which were normal.  She denies any excessive caffeine intake, only drinks water herbal tea.  Also recently had endoscopy for evaluation of worsening GERD without any acute complications.  Did have some biopsies taken but has not yet been notified of the results.  Patient has no known cardiac history, no familial history of arrhythmia.  She has no tachycardia or hypoxia and no significant risk factors for DVT/PE.  Have low suspicion for ACS, PE, dissection, acute cardiac event.  It may be beneficial to have her wear Holter monitor for further evaluation.  We will give her follow-up with cardiology, also recommended to  follow-up closely with her primary care doctor.  She will return here for any new or worsening symptoms.  Final Clinical Impressions(s) / ED Diagnoses   Final diagnoses:  Chest pain in adult    ED Discharge Orders    None       Garlon Hatchet, PA-C 09/07/18 0319    Dione Booze, MD 09/07/18 [phone]

## 2018-09-07 NOTE — ED Notes (Signed)
Patient verbalizes understanding of medications and discharge instructions. No further questions at this time. VSS and patient ambulatory at discharge.   

## 2018-10-05 DIAGNOSIS — I10 Essential (primary) hypertension: Secondary | ICD-10-CM | POA: Insufficient documentation

## 2018-10-05 DIAGNOSIS — R0789 Other chest pain: Secondary | ICD-10-CM | POA: Insufficient documentation

## 2018-10-26 DIAGNOSIS — R519 Headache, unspecified: Secondary | ICD-10-CM | POA: Insufficient documentation

## 2018-10-26 DIAGNOSIS — H9201 Otalgia, right ear: Secondary | ICD-10-CM | POA: Insufficient documentation

## 2018-11-11 DIAGNOSIS — H6981 Other specified disorders of Eustachian tube, right ear: Secondary | ICD-10-CM | POA: Insufficient documentation

## 2018-11-11 DIAGNOSIS — H6991 Unspecified Eustachian tube disorder, right ear: Secondary | ICD-10-CM | POA: Insufficient documentation

## 2019-12-08 DIAGNOSIS — R0681 Apnea, not elsewhere classified: Secondary | ICD-10-CM | POA: Insufficient documentation

## 2019-12-08 DIAGNOSIS — G4730 Sleep apnea, unspecified: Secondary | ICD-10-CM | POA: Insufficient documentation

## 2020-07-30 ENCOUNTER — Other Ambulatory Visit: Payer: Self-pay

## 2020-07-30 ENCOUNTER — Ambulatory Visit
Admission: EM | Admit: 2020-07-30 | Discharge: 2020-07-30 | Disposition: A | Discharge status: Home / Self Care | Payer: No Typology Code available for payment source | Attending: Physician Assistant | Admitting: Physician Assistant

## 2020-07-30 ENCOUNTER — Ambulatory Visit: Admission: EM | Admit: 2020-07-30 | Discharge: 2020-07-30 | Discharge status: Absent without leave | Payer: Self-pay

## 2020-07-30 ENCOUNTER — Ambulatory Visit: Payer: Self-pay

## 2020-07-30 DIAGNOSIS — K219 Gastro-esophageal reflux disease without esophagitis: Secondary | ICD-10-CM

## 2020-07-30 HISTORY — DX: Essential (primary) hypertension: I10

## 2020-07-30 HISTORY — DX: Anxiety disorder, unspecified: F41.9

## 2020-07-30 HISTORY — DX: Gastro-esophageal reflux disease without esophagitis: K21.9

## 2020-07-30 LAB — POCT URINE PREGNANCY: Preg Test, Ur: NEGATIVE

## 2020-07-30 MED ORDER — ALUM & MAG HYDROXIDE-SIMETH 200-200-20 MG/5ML PO SUSP
30.0000 mL | Freq: Once | ORAL | Status: AC
  Administered 2020-07-30: 30 mL via ORAL

## 2020-07-30 MED ORDER — LIDOCAINE VISCOUS HCL 2 % MT SOLN
15.0000 mL | Freq: Once | OROMUCOSAL | Status: AC
  Administered 2020-07-30: 15 mL via ORAL

## 2020-07-30 NOTE — Discharge Instructions (Signed)
Urine pregnancy negative. As discussed, EKG did not show anything alarming for current symptoms. However, did show QT elongation, please make your PCP aware of this. GI cocktail given today. Continue omeprazole. Can add over the counter pepcid 20mg  once daily for 7-10 days if needed. Follow up with PCP if symptoms not improving.

## 2020-07-30 NOTE — ED Triage Notes (Signed)
Pt c/o center chest pain/burn radiating under lt breast up to lt shoulder and through to back x 3days. States hx of acid reflux and anxiety. Pt denies SOB or diaphoresis. No distress noted.

## 2020-07-30 NOTE — ED Provider Notes (Signed)
[name]    CSN: [id number] Arrival date & time: 07/30/20  1916      History   Chief Complaint Chief Complaint  Patient presents with  . Chest Pain    HPI LEONELA KIVI is a 34 y.o. female.   34 year old female comes in for 3 day history of GERD like symptoms. Center chest pain with burning sensation that can radiate to left breast/left shoulder. Denies nausea/vomiting. Had abdominal cramping that resolved. Denies diarrhea, constipation. Globus sensation causing her to clear her throat. Denies URI symptoms. Denies fever. Denies increase in NSAID use, increase in alcohol use, caffeine use.  Denies exertional chest pain, exertional fatigue, dyspnea on exertion.  Takes daily omeprazole.     Past Medical History:  Diagnosis Date  . Acid reflux   . Anxiety   . Hypertension     Patient Active Problem List   Diagnosis Date Noted  . Encounter for routine gynecological examination 06/15/2014  . Well woman exam with routine gynecological exam 06/12/2014  . Proteinuria 04/07/2014  . Preventative health care 04/03/2014  . Unspecified contraceptive management 04/03/2014    History reviewed. No pertinent surgical history.  OB History   No obstetric history on file.      Home Medications    Prior to Admission medications   Medication Sig Start Date End Date Taking? Authorizing Provider  FLUoxetine (PROZAC) 10 MG tablet Take 10 mg by mouth daily.   Yes [provider]  labetalol (NORMODYNE) 100 MG tablet Take 100 mg by mouth 2 (two) times daily.   Yes [provider]  omeprazole (PRILOSEC) 40 MG capsule Take 40 mg by mouth daily.   Yes [provider]    Family History Family History  Problem Relation Age of Onset  . Hypertension Mother   . Hyperlipidemia Father   . Hypertension Maternal Grandmother   . Diabetes Paternal Grandmother   . Cancer Paternal Grandmother        skin cancer melanoma  . Cancer Paternal Grandfather         prostate  . Cancer Other        colon and breast    Social History Social History   Tobacco Use  . Smoking status: Never Smoker  . Smokeless tobacco: Never Used  Substance Use Topics  . Alcohol use: Yes    Alcohol/week: 2.0 - 3.0 standard drinks    Types: 2 - 3 Glasses of wine per week  . Drug use: No     Allergies   Patient has no known allergies.   Review of Systems Review of Systems  Reason unable to perform ROS: See HPI as above.     Physical Exam Triage Vital Signs ED Triage Vitals  Enc Vitals Group     BP 07/30/20 2017 (!) 149/90     Pulse Rate 07/30/20 2017 76     Resp 07/30/20 2017 16     Temp 07/30/20 2017 99.3 F (37.4 C)     Temp Source 07/30/20 2017 Oral     SpO2 07/30/20 2017 96 %     Weight --      Height --      Head Circumference --      Peak Flow --      Pain Score 07/30/20 2023 7     Pain Loc --      Pain Edu? --      Excl. in GC? --    No data found.  Updated Vital Signs BP (!) 149/90 (BP Location: Left Arm)   Pulse 76   Temp 99.3 F (37.4 C) (Oral)   Resp 16   LMP 07/02/2020   SpO2 96%   Physical Exam Constitutional:      General: She is not in acute distress.    Appearance: Normal appearance. She is well-developed. She is not toxic-appearing or diaphoretic.  HENT:     Head: Normocephalic and atraumatic.  Eyes:     Conjunctiva/sclera: Conjunctivae normal.     Pupils: Pupils are equal, round, and reactive to light.  Cardiovascular:     Rate and Rhythm: Normal rate and regular rhythm.  Pulmonary:     Effort: Pulmonary effort is normal. No respiratory distress.     Comments: LCTAB Chest:     Chest wall: No tenderness.  Abdominal:     General: Bowel sounds are normal.     Palpations: Abdomen is soft.     Tenderness: There is no abdominal tenderness. There is no guarding or rebound.  Musculoskeletal:     Cervical back: Normal range of motion and neck supple.  Skin:    General: Skin is warm and dry.   Neurological:     Mental Status: She is alert and oriented to person, place, and time.      UC Treatments / Results  Labs (all labs ordered are listed, but only abnormal results are displayed) Labs Reviewed  POCT URINE PREGNANCY    EKG   Radiology No results found.  Procedures Procedures (including critical care time)  Medications Ordered in UC Medications  alum & mag hydroxide-simeth (MAALOX/MYLANTA) 200-200-20 MG/5ML suspension 30 mL (30 mLs Oral Given 07/30/20 2054)    And  lidocaine (XYLOCAINE) 2 % viscous mouth solution 15 mL (15 mLs Oral Given 07/30/20 2054)    Initial Impression / Assessment and Plan / UC Course  I have reviewed the triage vital signs and the nursing notes.  Pertinent labs & imaging results that were available during my care of the patient were reviewed by me and considered in my medical decision making (see chart for details).    EKG NSR, 76bpm, prolonged QT, no ST changes.  Patient history and exam low suspicion for ACS.  Will treat for GERD with GI cocktail.  Will continue omeprazole, and can add short course of Pepcid if needed.  Otherwise to follow-up with PCP for further evaluation and management needed.  Return precautions given.  Final Clinical Impressions(s) / UC Diagnoses   Final diagnoses:  Gastroesophageal reflux disease without esophagitis    ED Prescriptions    None     PDMP not reviewed this encounter.   Belinda Fisher, PA-C 07/31/20 [phone]

## 2021-09-03 ENCOUNTER — Ambulatory Visit: Admit: 2021-09-03 | Payer: No Typology Code available for payment source

## 2021-09-04 ENCOUNTER — Other Ambulatory Visit: Payer: Self-pay

## 2021-09-04 ENCOUNTER — Telehealth (INDEPENDENT_AMBULATORY_CARE_PROVIDER_SITE_OTHER): Payer: No Typology Code available for payment source | Admitting: Nurse Practitioner

## 2021-09-04 ENCOUNTER — Encounter: Payer: Self-pay | Admitting: Nurse Practitioner

## 2021-09-04 ENCOUNTER — Ambulatory Visit
Admission: EM | Admit: 2021-09-04 | Discharge: 2021-09-04 | Disposition: A | Discharge status: Home / Self Care | Payer: No Typology Code available for payment source | Attending: Physician Assistant | Admitting: Physician Assistant

## 2021-09-04 DIAGNOSIS — M5412 Radiculopathy, cervical region: Secondary | ICD-10-CM

## 2021-09-04 DIAGNOSIS — N979 Female infertility, unspecified: Secondary | ICD-10-CM | POA: Diagnosis not present

## 2021-09-04 MED ORDER — PREDNISONE 20 MG PO TABS: 40.0000 mg | ORAL_TABLET | Freq: Every day | ORAL | 0 refills | Status: AC

## 2021-09-04 NOTE — ED Triage Notes (Signed)
Pt c/o burning sensation to left arm that radiates towards back and chest. Onset Monday morning. States this happened before and was dx with acid reflux.

## 2021-09-04 NOTE — Patient Instructions (Signed)
Encounter to establish care Fertility problems:  Will place referral to Gyn for further evaluation  Stay active  Stay well hydrated  Follow up:  Follow up for CPE with Dr. Andrey Campanile in 4 weeks

## 2021-09-04 NOTE — ED Provider Notes (Signed)
[name]    CSN: [id number] Arrival date & time: 09/04/21  0805      History   Chief Complaint Chief Complaint  Patient presents with   Numbness    HPI Gina Mcdowell is a 35 y.o. female.   Patient here today for evaluation of tingling that radiates from her left shoulder into her left arm across her chest that started 2 days ago.  She reports the day before she had been helping carry a mattress upstairs and is not sure if this caused symptoms.  She reports she has had similar symptoms in the past with acid reflux, but she has been taking her omeprazole without significant relief.  Nothing makes symptoms worse.  She denies any shortness of breath, or lightheadedness.  She has had some nausea but no reported vomiting.  The history is provided by the patient.   Past Medical History:  Diagnosis Date   Acid reflux    Anxiety    Hypertension     Patient Active Problem List   Diagnosis Date Noted   Encounter for routine gynecological examination 06/15/2014   Well woman exam with routine gynecological exam 06/12/2014   Proteinuria 04/07/2014   Preventative health care 04/03/2014   Unspecified contraceptive management 04/03/2014    History reviewed. No pertinent surgical history.  OB History   No obstetric history on file.      Home Medications    Prior to Admission medications   Medication Sig Start Date End Date Taking? Authorizing Provider  predniSONE (DELTASONE) 20 MG tablet Take 2 tablets (40 mg total) by mouth daily with breakfast for 5 days. 09/04/21 09/09/21 Yes Francene Finders, PA-C  FLUoxetine (PROZAC) 10 MG tablet Take 10 mg by mouth daily.    [provider]  labetalol (NORMODYNE) 100 MG tablet Take 100 mg by mouth 2 (two) times daily.    [provider]  omeprazole (PRILOSEC) 40 MG capsule Take 40 mg by mouth daily.    [provider]    Family History Family History  Problem Relation Age of Onset    Hypertension Mother    Hyperlipidemia Father    Hypertension Maternal Grandmother    Diabetes Paternal Grandmother    Cancer Paternal Grandmother        skin cancer melanoma   Cancer Paternal Grandfather        prostate   Cancer Other        colon and breast    Social History Social History   Tobacco Use   Smoking status: Never   Smokeless tobacco: Never  Substance Use Topics   Alcohol use: Yes    Alcohol/week: 2.0 - 3.0 standard drinks    Types: 2 - 3 Glasses of wine per week   Drug use: No     Allergies   Patient has no known allergies.   Review of Systems Review of Systems  Constitutional:  Negative for chills and fever.  Eyes:  Negative for discharge and redness.  Respiratory:  Negative for chest tightness, shortness of breath and wheezing.   Gastrointestinal:  Positive for nausea. Negative for abdominal pain and vomiting.  Genitourinary:  Positive for vaginal bleeding and vaginal discharge.  Neurological:  Positive for numbness. Negative for light-headedness.    Physical Exam Triage Vital Signs ED Triage Vitals  Enc Vitals Group     BP      Pulse      Resp      Temp  Temp src      SpO2      Weight      Height      Head Circumference      Peak Flow      Pain Score      Pain Loc      Pain Edu?      Excl. in GC?    No data found.  Updated Vital Signs BP (!) 143/90 (BP Location: Left Arm)   Pulse 77   Temp 98.4 F (36.9 C) (Oral)   Resp 18   SpO2 98%      Physical Exam Vitals and nursing note reviewed.  Constitutional:      General: She is not in acute distress.    Appearance: Normal appearance. She is not ill-appearing.  HENT:     Head: Normocephalic and atraumatic.  Eyes:     Conjunctiva/sclera: Conjunctivae normal.  Cardiovascular:     Rate and Rhythm: Normal rate and regular rhythm.     Heart sounds: Normal heart sounds. No murmur heard. Pulmonary:     Effort: Pulmonary effort is normal. No respiratory distress.     Breath  sounds: Normal breath sounds. No wheezing, rhonchi or rales.  Musculoskeletal:        General: No tenderness (no TTP to CSpine, Left Shoulder).     Comments: ROM of cervical spine normal  Skin:    General: Skin is warm and dry.  Neurological:     Mental Status: She is alert.     Comments: Gross sensation intact and equal to bilateral distal fingers, grip strength 5/5 bilaterally  Psychiatric:        Mood and Affect: Mood normal.        Behavior: Behavior normal.        Thought Content: Thought content normal.     UC Treatments / Results  Labs (all labs ordered are listed, but only abnormal results are displayed) Labs Reviewed - No data to display  EKG   Radiology No results found.  Procedures Procedures (including critical care time)  Medications Ordered in UC Medications - No data to display  Initial Impression / Assessment and Plan / UC Course  I have reviewed the triage vital signs and the nursing notes.  Pertinent labs & imaging results that were available during my care of the patient were reviewed by me and considered in my medical decision making (see chart for details).  Symptoms most consistent with cervical radiculopathy and discussed with patient.  We will trial prednisone burst and recommended follow-up if symptoms fail to improve or worsen.  Given similar presentation with acid reflux before recommend she continue her omeprazole but follow-up with her PCP in the event medication changes necessary.  Very low suspicion for cardiac etiology of symptoms given lack of other concerning findings including shortness of breath and lightheadedness as well as lack of risk factors.  Final Clinical Impressions(s) / UC Diagnoses   Final diagnoses:  Cervical radiculopathy   Discharge Instructions   None    ED Prescriptions     Medication Sig Dispense Auth. Provider   predniSONE (DELTASONE) 20 MG tablet Take 2 tablets (40 mg total) by mouth daily with breakfast for 5  days. 10 tablet Tomi Bamberger, PA-C      PDMP not reviewed this encounter.   Tomi Bamberger, PA-C 09/04/21 [phone]

## 2021-09-04 NOTE — Progress Notes (Signed)
Virtual Visit via Telephone Note  I connected with Gina Mcdowell on 09/04/21 at  2:00 PM EDT by telephone and verified that I am speaking with the correct person using two identifiers.  Location: Patient: home Provider: office   I discussed the limitations, risks, security and privacy concerns of performing an evaluation and management service by telephone and the availability of in person appointments. I also discussed with the patient that there may be a patient responsible charge related to this service. The patient expressed understanding and agreed to proceed.   History of Present Illness:  Patient presents today to establish care.  This is a televisit.  Patient does have a past history of hypertension, GERD, anxiety.  She states that her blood pressures have been within normal range around 120/80 at home.  Her previous PCP was with [facility].  She would like to establish care at this practice.  Patient is requesting referral to GYN for ongoing fertility issues.  She states that she has been trying to get pregnant for the past couple years.  She states that she does have regular menstrual cycles. Denies f/c/s, n/v/d, hemoptysis, PND, chest pain or edema.    Observations/Objective:  Vitals with BMI 09/04/2021 07/30/2020 09/07/2018  Height - - -  Weight - - -  BMI - - -  Systolic 143 149 478  Diastolic 90 90 88  Pulse 77 76 80      Assessment and Plan:  Encounter to establish care Fertility problems:  Will place referral to Gyn for further evaluation  Stay active  Stay well hydrated  Follow up:  Follow up for CPE with Dr. Andrey Campanile in 4 weeks      I discussed the assessment and treatment plan with the patient. The patient was provided an opportunity to ask questions and all were answered. The patient agreed with the plan and demonstrated an understanding of the instructions.   The patient was advised to call back or seek an in-person evaluation if the symptoms  worsen or if the condition fails to improve as anticipated.  I provided 23 minutes of non-face-to-face time during this encounter.   Gina Andrew, NP

## 2021-09-05 ENCOUNTER — Telehealth: Payer: Self-pay | Admitting: Radiology

## 2021-09-05 NOTE — Telephone Encounter (Signed)
Left message for patient to call [facility] to schedule New GYN appointment from referral for fertility issues.

## 2021-10-30 NOTE — Progress Notes (Signed)
Patient ID: Gina Mcdowell, female    DOB: [birth year]  MRN: [id number]  CC: Annual Physical Exam   Subjective: Gina Mcdowell is a 35 y.o. female who presents for annual physical exam.   Her concerns today include:  Reports her and husband trying to have a baby for 6 years. Last period early December 2022. Not currently taking birth control and has not for about 6 years. Was previously taking Depo and birth control pills over 6 years ago. Reports husband is 82 years older than her with history of open heart surgery. Reports husband does have an 93 year-old child. Denies family history of infertility. Would like referral to OB/GYN and fertility doctor.    Patient Active Problem List   Diagnosis Date Noted   Apneic episode 12/08/2019   Sleep disorder breathing 12/08/2019   Dysfunction of right eustachian tube 11/11/2018   Headache disorder 10/26/2018   Referred otalgia of right ear 10/26/2018   Atypical chest pain 10/05/2018   Essential hypertension 10/05/2018   GERD (gastroesophageal reflux disease) 07/28/2018   Encounter for routine gynecological examination 06/15/2014   Well woman exam with routine gynecological exam 06/12/2014   Proteinuria 04/07/2014   Preventative health care 04/03/2014   Unspecified contraceptive management 04/03/2014     Current Outpatient Medications on File Prior to Visit  Medication Sig Dispense Refill   FLUoxetine (PROZAC) 10 MG tablet Take 10 mg by mouth daily.     labetalol (NORMODYNE) 100 MG tablet Take 100 mg by mouth 2 (two) times daily.     omeprazole (PRILOSEC) 40 MG capsule Take 40 mg by mouth daily.     No current facility-administered medications on file prior to visit.    No Known Allergies  Social History   Socioeconomic History   Marital status: Married    Spouse name: Not on file   Number of children: Not on file   Years of education: Not on file   Highest education level: Not on file  Occupational History   Not on file   Tobacco Use   Smoking status: Never   Smokeless tobacco: Never  Substance and Sexual Activity   Alcohol use: Yes    Alcohol/week: 2.0 - 3.0 standard drinks    Types: 2 - 3 Glasses of wine per week   Drug use: No   Sexual activity: Yes    Birth control/protection: Condom  Other Topics Concern   Not on file  Social History Narrative   ** Merged History Encounter **       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   Associates in medical billing/coding   Social Determinants of Health   Financial Resource Strain: Not on file  Food Insecurity: Not on file  Transportation Needs: Not on file  Physical Activity: Not on file  Stress: Not on file  Social Connections: Not on file  Intimate Partner Violence: Not on file    Family History  Problem Relation Age of Onset   Hypertension Mother    Hyperlipidemia Father    Hypertension Maternal Grandmother    Diabetes Paternal Grandmother    Cancer Paternal Grandmother        skin cancer melanoma   Cancer Paternal Grandfather        prostate   Cancer Other  colon and breast    History reviewed. No pertinent surgical history.  ROS: Review of Systems Negative except as stated above  PHYSICAL EXAM: BP 132/83 (BP Location: Left Arm, Patient Position: Sitting, Cuff Size: Normal)    Pulse 86    Temp 98.3 F (36.8 C)    Resp 19    Ht 5' 4.17" (1.63 m)    Wt 199 lb 9.6 oz (90.5 kg)    SpO2 98%    BMI 34.08 kg/m   Physical Exam Exam conducted with a chaperone present.  HENT:     Head: Normocephalic and atraumatic.     Right Ear: Tympanic membrane, ear canal and external ear normal.     Left Ear: Tympanic membrane, ear canal and external ear normal.  Eyes:     Extraocular Movements: Extraocular movements intact.     Conjunctiva/sclera: Conjunctivae normal.     Pupils: Pupils are  equal, round, and reactive to light.  Cardiovascular:     Rate and Rhythm: Normal rate and regular rhythm.     Pulses: Normal pulses.     Heart sounds: Normal heart sounds.  Chest:     Comments: Patient declined exam. Abdominal:     General: Bowel sounds are normal.     Palpations: Abdomen is soft.  Genitourinary:    General: Normal vulva.     Vagina: Normal.     Cervix: Normal.     Uterus: Normal.      Adnexa: Right adnexa normal and left adnexa normal.     Comments: Elmon Else, CMA present during exam. Musculoskeletal:        General: Normal range of motion.     Cervical back: Normal range of motion and neck supple.  Skin:    General: Skin is warm and dry.     Capillary Refill: Capillary refill takes less than 2 seconds.  Neurological:     General: No focal deficit present.     Mental Status: She is alert and oriented to person, place, and time.  Psychiatric:        Mood and Affect: Mood normal.        Behavior: Behavior normal.    ASSESSMENT AND PLAN: 1. Annual physical exam: - Counseled on 150 minutes of exercise per week as tolerated, healthy eating (including decreased daily intake of saturated fats, cholesterol, added sugars, sodium), STI prevention, and routine healthcare maintenance.  2. Screening for metabolic disorder: - VQM08+QPYP to check kidney function, liver function, and electrolyte balance.  - CMP14+EGFR  3. Screening for deficiency anemia: - CBC to screen for anemia. - CBC  4. Diabetes mellitus screening: - Hemoglobin A1c to screen for pre-diabetes/diabetes. - Hemoglobin A1c  5. Screening cholesterol level: - Lipid panel to screen for high cholesterol.  - Lipid panel  6. Thyroid disorder screen: - TSH to check thyroid function.  - TSH  7. Need for hepatitis C screening test: - Hepatitis C antibody to screen for hepatitis C.  - Hepatitis C Antibody  8. Pap smear for cervical cancer screening: - Cytology - PAP for cervical cancer  screening.  - Cytology - PAP([facility])  9. Routine screening for STI (sexually transmitted infection): - Cervicovaginal self-swab to screen for chlamydia, gonorrhea, trichomonas, bacterial vaginitis, and candida vaginitis. - Cervicovaginal ancillary only  10. Infertility counseling: - Referral to Obstetrics / Gynecology and Endocrinology for further evaluation and management. - Ambulatory referral to Obstetrics / Gynecology - Ambulatory referral to Endocrinology    Patient was given  the opportunity to ask questions.  Patient verbalized understanding of the plan and was able to repeat key elements of the plan. Patient was given clear instructions to go to Emergency Department or return to medical center if symptoms don't improve, worsen, or new problems develop.The patient verbalized understanding.   Orders Placed This Encounter  Procedures   Hepatitis C Antibody   CBC   Lipid panel   TSH   CMP14+EGFR   Hemoglobin A1c   Ambulatory referral to Obstetrics / Gynecology   Ambulatory referral to Endocrinology    Follow-up with primary provider as scheduled.  Camillia Herter, NP

## 2021-11-06 ENCOUNTER — Other Ambulatory Visit: Payer: Self-pay

## 2021-11-06 ENCOUNTER — Encounter: Payer: Self-pay | Admitting: Family

## 2021-11-06 ENCOUNTER — Ambulatory Visit (INDEPENDENT_AMBULATORY_CARE_PROVIDER_SITE_OTHER): Payer: 59 | Admitting: Family

## 2021-11-06 ENCOUNTER — Other Ambulatory Visit (HOSPITAL_COMMUNITY)
Admission: RE | Admit: 2021-11-06 | Discharge: 2021-11-06 | Disposition: A | Discharge status: Home / Self Care | Payer: 59 | Source: Ambulatory Visit | Attending: Family | Admitting: Family

## 2021-11-06 VITALS — BP 132/83 | HR 86 | Temp 98.3°F | Resp 19 | Ht 64.17 in | Wt 199.6 lb

## 2021-11-06 DIAGNOSIS — Z0000 Encounter for general adult medical examination without abnormal findings: Secondary | ICD-10-CM | POA: Diagnosis not present

## 2021-11-06 DIAGNOSIS — Z13228 Encounter for screening for other metabolic disorders: Secondary | ICD-10-CM

## 2021-11-06 DIAGNOSIS — Z124 Encounter for screening for malignant neoplasm of cervix: Secondary | ICD-10-CM

## 2021-11-06 DIAGNOSIS — Z1159 Encounter for screening for other viral diseases: Secondary | ICD-10-CM

## 2021-11-06 DIAGNOSIS — Z113 Encounter for screening for infections with a predominantly sexual mode of transmission: Secondary | ICD-10-CM

## 2021-11-06 DIAGNOSIS — Z130 Encounter for screening for diseases of the blood and blood-forming organs and certain disorders involving the immune mechanism: Secondary | ICD-10-CM

## 2021-11-06 DIAGNOSIS — Z131 Encounter for screening for diabetes mellitus: Secondary | ICD-10-CM

## 2021-11-06 DIAGNOSIS — Z1329 Encounter for screening for other suspected endocrine disorder: Secondary | ICD-10-CM

## 2021-11-06 DIAGNOSIS — Z3169 Encounter for other general counseling and advice on procreation: Secondary | ICD-10-CM | POA: Diagnosis not present

## 2021-11-06 DIAGNOSIS — Z13220 Encounter for screening for lipoid disorders: Secondary | ICD-10-CM

## 2021-11-06 NOTE — Patient Instructions (Signed)
Preventive Care 21-35 Years Old, Female °Preventive care refers to lifestyle choices and visits with your health care provider that can promote health and wellness. Preventive care visits are also called wellness exams. °What can I expect for my preventive care visit? °Counseling °During your preventive care visit, your health care provider may ask about your: °Medical history, including: °Past medical problems. °Family medical history. °Pregnancy history. °Current health, including: °Menstrual cycle. °Method of birth control. °Emotional well-being. °Home life and relationship well-being. °Sexual activity and sexual health. °Lifestyle, including: °Alcohol, nicotine or tobacco, and drug use. °Access to firearms. °Diet, exercise, and sleep habits. °Work and work environment. °Sunscreen use. °Safety issues such as seatbelt and bike helmet use. °Physical exam °Your health care provider may check your: °Height and weight. These may be used to calculate your BMI (body mass index). BMI is a measurement that tells if you are at a healthy weight. °Waist circumference. This measures the distance around your waistline. This measurement also tells if you are at a healthy weight and may help predict your risk of certain diseases, such as type 2 diabetes and high blood pressure. °Heart rate and blood pressure. °Body temperature. °Skin for abnormal spots. °What immunizations do I need? °Vaccines are usually given at various ages, according to a schedule. Your health care provider will recommend vaccines for you based on your age, medical history, and lifestyle or other factors, such as travel or where you work. °What tests do I need? °Screening °Your health care provider may recommend screening tests for certain conditions. This may include: °Pelvic exam and Pap test. °Lipid and cholesterol levels. °Diabetes screening. This is done by checking your blood sugar (glucose) after you have not eaten for a while (fasting). °Hepatitis B  test. °Hepatitis C test. °HIV (human immunodeficiency virus) test. °STI (sexually transmitted infection) testing, if you are at risk. °BRCA-related cancer screening. This may be done if you have a family history of breast, ovarian, tubal, or peritoneal cancers. °Talk with your health care provider about your test results, treatment options, and if necessary, the need for more tests. °Follow these instructions at home: °Eating and drinking ° °Eat a healthy diet that includes fresh fruits and vegetables, whole grains, lean protein, and low-fat dairy products. °Take vitamin and mineral supplements as recommended by your health care provider. °Do not drink alcohol if: °Your health care provider tells you not to drink. °You are pregnant, may be pregnant, or are planning to become pregnant. °If you drink alcohol: °Limit how much you have to 0-1 drink a day. °Know how much alcohol is in your drink. In the [place] one drink equals one 12 oz bottle of beer (355 mL), one 5 oz glass of wine (148 mL), or one 1½ oz glass of hard liquor (44 mL). °Lifestyle °Brush your teeth every morning and night with fluoride toothpaste. Floss one time each day. °Exercise for at least 30 minutes 5 or more days each week. °Do not use any products that contain nicotine or tobacco. These products include cigarettes, chewing tobacco, and vaping devices, such as e-cigarettes. If you need help quitting, ask your health care provider. °Do not use drugs. °If you are sexually active, practice safe sex. Use a condom or other form of protection to prevent STIs. °If you do not wish to become pregnant, use a form of birth control. If you plan to become pregnant, see your health care provider for a prepregnancy visit. °Find healthy ways to manage stress, such as: °Meditation, yoga,   or listening to music. °Journaling. °Talking to a trusted person. °Spending time with friends and family. °Minimize exposure to UV radiation to reduce your risk of skin  cancer. °Safety °Always wear your seat belt while driving or riding in a vehicle. °Do not drive: °If you have been drinking alcohol. Do not ride with someone who has been drinking. °If you have been using any mind-altering substances or drugs. °While texting. °When you are tired or distracted. °Wear a helmet and other protective equipment during sports activities. °If you have firearms in your house, make sure you follow all gun safety procedures. °Seek help if you have been physically or sexually abused. °What's next? °Go to your health care provider once a year for an annual wellness visit. °Ask your health care provider how often you should have your eyes and teeth checked. °Stay up to date on all vaccines. °This information is not intended to replace advice given to you by your health care provider. Make sure you discuss any questions you have with your health care provider. °Document Revised: 04/17/2021 Document Reviewed: 04/17/2021 °Elsevier Patient Education © 2022 [employer] ° °

## 2021-11-06 NOTE — Progress Notes (Signed)
.  Pt presents for annual physical exam w/pap 

## 2021-11-07 ENCOUNTER — Other Ambulatory Visit: Payer: Self-pay | Admitting: Family

## 2021-11-07 ENCOUNTER — Other Ambulatory Visit: Payer: Self-pay

## 2021-11-07 ENCOUNTER — Encounter: Payer: Self-pay | Admitting: Family

## 2021-11-07 DIAGNOSIS — E785 Hyperlipidemia, unspecified: Secondary | ICD-10-CM | POA: Insufficient documentation

## 2021-11-07 DIAGNOSIS — B9689 Other specified bacterial agents as the cause of diseases classified elsewhere: Secondary | ICD-10-CM | POA: Insufficient documentation

## 2021-11-07 DIAGNOSIS — N760 Acute vaginitis: Secondary | ICD-10-CM | POA: Insufficient documentation

## 2021-11-07 DIAGNOSIS — B3731 Acute candidiasis of vulva and vagina: Secondary | ICD-10-CM | POA: Insufficient documentation

## 2021-11-07 LAB — CMP14+EGFR
ALT: 7 IU/L (ref 0–32)
AST: 15 IU/L (ref 0–40)
Albumin/Globulin Ratio: 1.9 (ref 1.2–2.2)
Albumin: 4.3 g/dL (ref 3.8–4.8)
Alkaline Phosphatase: 71 IU/L (ref 44–121)
BUN/Creatinine Ratio: 10 (ref 9–23)
BUN: 9 mg/dL (ref 6–20)
Bilirubin Total: 0.4 mg/dL (ref 0.0–1.2)
CO2: 24 mmol/L (ref 20–29)
Calcium: 9.2 mg/dL (ref 8.7–10.2)
Chloride: 102 mmol/L (ref 96–106)
Creatinine, Ser: 0.93 mg/dL (ref 0.57–1.00)
Globulin, Total: 2.3 g/dL (ref 1.5–4.5)
Glucose: 86 mg/dL (ref 70–99)
Potassium: 4.1 mmol/L (ref 3.5–5.2)
Sodium: 136 mmol/L (ref 134–144)
Total Protein: 6.6 g/dL (ref 6.0–8.5)
eGFR: 83 mL/min/{1.73_m2} (ref >59)

## 2021-11-07 LAB — CERVICOVAGINAL ANCILLARY ONLY
Bacterial Vaginitis (gardnerella): POSITIVE — AB
Candida Glabrata: NEGATIVE
Candida Vaginitis: POSITIVE — AB
Chlamydia: NEGATIVE
Comment: NEGATIVE
Comment: NEGATIVE
Comment: NEGATIVE
Comment: NEGATIVE
Comment: NEGATIVE
Comment: NORMAL
Neisseria Gonorrhea: NEGATIVE
Trichomonas: NEGATIVE

## 2021-11-07 LAB — CBC
Hematocrit: 41.6 % (ref 34.0–46.6)
Hemoglobin: 14 g/dL (ref 11.1–15.9)
MCH: 27.8 pg (ref 26.6–33.0)
MCHC: 33.7 g/dL (ref 31.5–35.7)
MCV: 83 fL (ref 79–97)
Platelets: 338 10*3/uL (ref 150–450)
RBC: 5.04 x10E6/uL (ref 3.77–5.28)
RDW: 13.4 % (ref 11.7–15.4)
WBC: 7.1 10*3/uL (ref 3.4–10.8)

## 2021-11-07 LAB — HEMOGLOBIN A1C
Est. average glucose Bld gHb Est-mCnc: 100 mg/dL
Hgb A1c MFr Bld: 5.1 % (ref 4.8–5.6)

## 2021-11-07 LAB — LIPID PANEL
Chol/HDL Ratio: 5.8 ratio — ABNORMAL HIGH (ref 0.0–4.4)
Cholesterol, Total: 214 mg/dL — ABNORMAL HIGH (ref 100–199)
HDL: 37 mg/dL — ABNORMAL LOW (ref >39)
LDL Chol Calc (NIH): 143 mg/dL — ABNORMAL HIGH (ref 0–99)
Triglycerides: 189 mg/dL — ABNORMAL HIGH (ref 0–149)
VLDL Cholesterol Cal: 34 mg/dL (ref 5–40)

## 2021-11-07 LAB — TSH: TSH: 2.06 u[IU]/mL (ref 0.450–4.500)

## 2021-11-07 LAB — HEPATITIS C ANTIBODY: Hep C Virus Ab: 0.3 s/co ratio (ref 0.0–0.9)

## 2021-11-07 MED ORDER — FLUCONAZOLE 150 MG PO TABS: 150.0000 mg | ORAL_TABLET | Freq: Once | ORAL | 0 refills | Status: AC

## 2021-11-07 MED ORDER — ATORVASTATIN CALCIUM 20 MG PO TABS: 20.0000 mg | ORAL_TABLET | Freq: Every day | ORAL | 0 refills | Status: DC

## 2021-11-07 MED ORDER — METRONIDAZOLE 500 MG PO TABS: 500.0000 mg | ORAL_TABLET | Freq: Two times a day (BID) | ORAL | 0 refills | Status: AC

## 2021-11-07 MED ORDER — ATORVASTATIN CALCIUM 20 MG PO TABS: 20.0000 mg | ORAL_TABLET | Freq: Every day | ORAL | 2 refills | Status: DC

## 2021-11-07 NOTE — Progress Notes (Signed)
Kidney function normal.   Liver function normal.  Thyroid function normal.   No diabetes.   No anemia.   Hepatitis C negative.   HIV negative.   Cholesterol higher than expected. High cholesterol may increase risk of heart attack and/or stroke. Consider eating more fruits, vegetables, and lean baked meats such as chicken or fish. Moderate intensity exercise at least 150 minutes as tolerated per week may help as well.   Begin Atorvastatin (Lipitor) for high cholesterol. Encouraged to recheck fasting cholesterol in 4 to 6 weeks.

## 2021-11-07 NOTE — Progress Notes (Signed)
Gonorrhea, Chlamydia, and Trichomonas negative.   Positive for Bacterial Vaginitis, an overgrowth of normal bacteria in the vagina. Prescribed Metronidazole (Flagyl) twice per day for 7 days. Do not drink alcohol while taking this medication as this may cause severe nausea, vomiting, and upset stomach.   Positive for Candida Vaginitis which is better known as a yeast infection. Prescribed Fluconazole 150 mg (1 tablet) for a one time dose to treat this.

## 2021-11-11 LAB — CYTOLOGY - PAP
Adequacy: ABSENT
Comment: NEGATIVE
Diagnosis: NEGATIVE
High risk HPV: NEGATIVE

## 2021-11-11 NOTE — Progress Notes (Signed)
PAP negative for lesion or malignancy. HPV negative. Repeat PAP in 3 years or sooner if needed.

## 2021-12-13 ENCOUNTER — Encounter: Payer: Self-pay | Admitting: Family

## 2021-12-16 ENCOUNTER — Other Ambulatory Visit: Payer: Self-pay | Admitting: Family

## 2021-12-16 ENCOUNTER — Other Ambulatory Visit: Payer: Self-pay

## 2021-12-16 DIAGNOSIS — F419 Anxiety disorder, unspecified: Secondary | ICD-10-CM

## 2021-12-16 DIAGNOSIS — I10 Essential (primary) hypertension: Secondary | ICD-10-CM

## 2021-12-16 DIAGNOSIS — E785 Hyperlipidemia, unspecified: Secondary | ICD-10-CM

## 2021-12-16 DIAGNOSIS — K219 Gastro-esophageal reflux disease without esophagitis: Secondary | ICD-10-CM

## 2021-12-16 MED ORDER — LABETALOL HCL 100 MG PO TABS: 100.0000 mg | ORAL_TABLET | Freq: Two times a day (BID) | ORAL | 0 refills | Status: DC

## 2021-12-16 MED ORDER — OMEPRAZOLE 40 MG PO CPDR: 40.0000 mg | DELAYED_RELEASE_CAPSULE | Freq: Every day | ORAL | 0 refills | Status: DC

## 2021-12-16 MED ORDER — ATORVASTATIN CALCIUM 20 MG PO TABS: 20.0000 mg | ORAL_TABLET | Freq: Every day | ORAL | 0 refills | Status: DC

## 2021-12-16 MED ORDER — FLUOXETINE HCL 10 MG PO TABS: 10.0000 mg | ORAL_TABLET | Freq: Every day | ORAL | 0 refills | Status: DC

## 2022-01-13 ENCOUNTER — Other Ambulatory Visit: Payer: Self-pay | Admitting: Family

## 2022-01-13 ENCOUNTER — Other Ambulatory Visit: Payer: Self-pay

## 2022-01-13 DIAGNOSIS — F419 Anxiety disorder, unspecified: Secondary | ICD-10-CM

## 2022-01-13 DIAGNOSIS — K219 Gastro-esophageal reflux disease without esophagitis: Secondary | ICD-10-CM

## 2022-01-13 DIAGNOSIS — I10 Essential (primary) hypertension: Secondary | ICD-10-CM

## 2022-01-13 DIAGNOSIS — F32A Depression, unspecified: Secondary | ICD-10-CM

## 2022-01-13 MED ORDER — LABETALOL HCL 100 MG PO TABS: 100.0000 mg | ORAL_TABLET | Freq: Two times a day (BID) | ORAL | 0 refills | Status: DC

## 2022-01-13 MED ORDER — OMEPRAZOLE 40 MG PO CPDR: 40.0000 mg | DELAYED_RELEASE_CAPSULE | Freq: Every day | ORAL | 0 refills | Status: DC

## 2022-01-13 MED ORDER — FLUOXETINE HCL 10 MG PO TABS: 10.0000 mg | ORAL_TABLET | Freq: Every day | ORAL | 0 refills | Status: DC

## 2022-01-13 NOTE — Telephone Encounter (Signed)
Labetalol and Omeprazole refilled on 01/13/2022 for a 90-day supply.  ? ?Fluoxetine refilled as requested.

## 2022-01-15 ENCOUNTER — Ambulatory Visit (INDEPENDENT_AMBULATORY_CARE_PROVIDER_SITE_OTHER): Payer: 59 | Admitting: Family Medicine

## 2022-01-15 ENCOUNTER — Other Ambulatory Visit: Payer: Self-pay

## 2022-01-15 ENCOUNTER — Encounter: Payer: Self-pay | Admitting: Family Medicine

## 2022-01-15 VITALS — BP 128/80 | HR 88 | Ht 63.0 in | Wt 202.2 lb

## 2022-01-15 DIAGNOSIS — Z3169 Encounter for other general counseling and advice on procreation: Secondary | ICD-10-CM | POA: Diagnosis not present

## 2022-01-15 NOTE — Assessment & Plan Note (Signed)
She appears to be ovulating. Normal labs from 1/23 to include HgbA1c and TSH. ?Check HSG and semen analysis and consider referral to REI. ?

## 2022-01-15 NOTE — Progress Notes (Signed)
Pt states she has been trying to conceive for 3 years. Pt was previously prescribed Clomid.   ?

## 2022-01-15 NOTE — Progress Notes (Signed)
? ?  Subjective:  ? ? Patient ID: Gina Mcdowell is a 36 y.o. female presenting with Infertility ? on 01/15/2022 ? ?HPI: ?Here for infertility. ?Cycles are 3-4 days, come monthly and on time.Has ovulations prediction kits showing she is ovulating. Has h/o gonorrhea in 2007. No h/o PID. ?Partner is 10 years older than her. Had CABG x 5 with first heart attack at 33. Has 34 yo. Works out of town in [place] but they have been having timed intercourse in [place]. ?No other w/u. ? ?Review of Systems  ?Constitutional:  Negative for chills and fever.  ?Respiratory:  Negative for shortness of breath.   ?Cardiovascular:  Negative for chest pain.  ?Gastrointestinal:  Negative for abdominal pain, nausea and vomiting.  ?Genitourinary:  Negative for dysuria.  ?Skin:  Negative for rash.  ?   ?Objective:  ?  ?BP 128/80   Pulse 88   Ht 5\' 3"  (1.6 m)   Wt 202 lb 3.2 oz (91.7 kg)   BMI 35.82 kg/m?  ?Physical Exam ?Exam conducted with a chaperone present.  ?Constitutional:   ?   General: She is not in acute distress. ?   Appearance: She is well-developed.  ?HENT:  ?   Head: Normocephalic and atraumatic.  ?Eyes:  ?   General: No scleral icterus. ?Cardiovascular:  ?   Rate and Rhythm: Normal rate.  ?Pulmonary:  ?   Effort: Pulmonary effort is normal.  ?Abdominal:  ?   Palpations: Abdomen is soft.  ?Musculoskeletal:  ?   Cervical back: Neck supple.  ?Skin: ?   General: Skin is warm and dry.  ?Neurological:  ?   Mental Status: She is alert and oriented to person, place, and time.  ? ? ?   ?Assessment & Plan:  ? ?Problem List Items Addressed This Visit   ? ?  ? Unprioritized  ? Infertility counseling - Primary  ?  She appears to be ovulating. Normal labs from 1/23 to include HgbA1c and TSH. ?Check HSG and semen analysis and consider referral to REI. ?  ?  ? Relevant Orders  ? DG Hysterogram (HSG)  ? ?Other Visit Diagnoses   ? ? Encounter for preconception consultation      ? would need to stop lipitor prior to pregnancy or once she realized she is  pregnant  ? ?  ? ? ?Return in about 4 weeks (around 02/12/2022) for a follow-up, virtual--needs HSG and semen analysis. ? ?04/14/2022, MD ?01/15/2022 ?10:13 AM ? ? ? ?

## 2022-02-10 ENCOUNTER — Ambulatory Visit: Payer: 59 | Admitting: Family Medicine

## 2022-02-18 ENCOUNTER — Other Ambulatory Visit: Payer: Self-pay | Admitting: Family

## 2022-02-18 DIAGNOSIS — F419 Anxiety disorder, unspecified: Secondary | ICD-10-CM

## 2022-02-18 MED ORDER — FLUOXETINE HCL 10 MG PO CAPS: 10.0000 mg | ORAL_CAPSULE | Freq: Every day | ORAL | 3 refills | Status: DC

## 2022-03-06 ENCOUNTER — Ambulatory Visit
Admission: RE | Admit: 2022-03-06 | Discharge: 2022-03-06 | Disposition: A | Discharge status: Home / Self Care | Payer: 59 | Source: Ambulatory Visit | Attending: Family Medicine | Admitting: Family Medicine

## 2022-03-06 DIAGNOSIS — Z3169 Encounter for other general counseling and advice on procreation: Secondary | ICD-10-CM

## 2022-04-16 ENCOUNTER — Ambulatory Visit
Admission: EM | Admit: 2022-04-16 | Discharge: 2022-04-16 | Disposition: A | Discharge status: Home / Self Care | Payer: 59 | Attending: Internal Medicine | Admitting: Internal Medicine

## 2022-04-16 DIAGNOSIS — K2900 Acute gastritis without bleeding: Secondary | ICD-10-CM

## 2022-04-16 MED ORDER — PANTOPRAZOLE SODIUM 20 MG PO TBEC: 20.0000 mg | DELAYED_RELEASE_TABLET | Freq: Every day | ORAL | 1 refills | Status: DC

## 2022-04-16 MED ORDER — ALUM & MAG HYDROXIDE-SIMETH 200-200-20 MG/5ML PO SUSP
30.0000 mL | Freq: Once | ORAL | Status: AC
  Administered 2022-04-16: 30 mL via ORAL

## 2022-04-16 MED ORDER — SUCRALFATE 1 G PO TABS: 1.0000 g | ORAL_TABLET | Freq: Three times a day (TID) | ORAL | 0 refills | Status: DC

## 2022-04-16 NOTE — ED Provider Notes (Signed)
[name]    CSN: [id number] Arrival date & time: 04/16/22  0801      History   Chief Complaint Chief Complaint  Patient presents with   Abdominal Pain    HPI Gina KESINGER is a 36 y.o. female comes to the urgent care with 3-day history of sharp epigastric abdominal pain.  Pain started 3 days ago and has been persistent.  Pain is currently 8-9 out of 10.  Pain is aggravated by oral intake and wakes patient up at night.  She feels bloated.  Pain has radiated to her back on occasion.  No melanotic stool.  No vomiting.  No weight loss.  Patient denies any NSAIDs or Goody powder use.  No heavy alcohol use.  No trauma to the abdomen.  No easy bruising or bleeding.   HPI  Past Medical History:  Diagnosis Date   Acid reflux    Anxiety    Hypertension     Patient Active Problem List   Diagnosis Date Noted   Infertility counseling 01/15/2022   Anxiety and depression 12/16/2021   Hyperlipidemia 11/07/2021   Sleep disorder breathing 12/08/2019   Headache disorder 10/26/2018   Essential hypertension 10/05/2018   GERD (gastroesophageal reflux disease) 07/28/2018   Proteinuria 04/07/2014    History reviewed. No pertinent surgical history.  OB History     Gravida  0   Para  0   Term  0   Preterm  0   AB  0   Living  0      SAB  0   IAB  0   Ectopic  0   Multiple  0   Live Births  0            Home Medications    Prior to Admission medications   Medication Sig Start Date End Date Taking? Authorizing Provider  pantoprazole (PROTONIX) 20 MG tablet Take 1 tablet (20 mg total) by mouth daily. 04/16/22  Yes Cola Gane, Britta Mccreedy, MD  sucralfate (CARAFATE) 1 g tablet Take 1 tablet (1 g total) by mouth 4 (four) times daily -  with meals and at bedtime for 10 days. 04/16/22 04/26/22 Yes Juleen Sorrels, Britta Mccreedy, MD  atorvastatin (LIPITOR) 20 MG tablet Take 1 tablet (20 mg total) by mouth daily. 12/16/21   Rema Fendt, NP  FLUoxetine (PROZAC) 10 MG  capsule Take 1 capsule (10 mg total) by mouth daily. 02/18/22 06/18/22  Rema Fendt, NP  labetalol (NORMODYNE) 100 MG tablet Take 1 tablet (100 mg total) by mouth 2 (two) times daily. 01/13/22 04/13/22  Rema Fendt, NP    Family History Family History  Problem Relation Age of Onset   Hypertension Mother    Hyperlipidemia Father    Hypertension Maternal Grandmother    Diabetes Paternal Grandmother    Cancer Paternal Grandmother        skin cancer melanoma   Cancer Paternal Grandfather        prostate   Cancer Other        colon and breast    Social History Social History   Tobacco Use   Smoking status: Never   Smokeless tobacco: Never  Substance Use Topics   Alcohol use: Yes    Alcohol/week: 2.0 - 3.0 standard drinks of alcohol    Types: 2 - 3 Glasses of wine per week   Drug use: No     Allergies   Patient has no known allergies.   Review of  Systems Review of Systems  Constitutional: Negative.   HENT: Negative.    Cardiovascular: Negative.   Gastrointestinal:  Positive for abdominal pain and nausea. Negative for abdominal distention, blood in stool, rectal pain and vomiting.  Neurological: Negative.      Physical Exam Triage Vital Signs ED Triage Vitals [04/16/22 0816]  Enc Vitals Group     BP 122/82     Pulse Rate 88     Resp 18     Temp 98.2 F (36.8 C)     Temp Source Oral     SpO2 98 %     Weight      Height      Head Circumference      Peak Flow      Pain Score 0     Pain Loc      Pain Edu?      Excl. in GC?    No data found.  Updated Vital Signs BP 122/82 (BP Location: Left Arm)   Pulse 88   Temp 98.2 F (36.8 C) (Oral)   Resp 18   SpO2 98%   Visual Acuity Right Eye Distance:   Left Eye Distance:   Bilateral Distance:    Right Eye Near:   Left Eye Near:    Bilateral Near:     Physical Exam Vitals and nursing note reviewed.  Constitutional:      General: She is not in acute distress.    Appearance: She is not  ill-appearing.  Cardiovascular:     Rate and Rhythm: Normal rate and regular rhythm.  Pulmonary:     Effort: Pulmonary effort is normal.     Breath sounds: Normal breath sounds.  Abdominal:     Palpations: Abdomen is soft.     Tenderness: There is abdominal tenderness in the epigastric area. There is no guarding or rebound.     Hernia: No hernia is present.  Neurological:     Mental Status: She is alert.      UC Treatments / Results  Labs (all labs ordered are listed, but only abnormal results are displayed) Labs Reviewed  CBC  LIPASE    EKG   Radiology No results found.  Procedures Procedures (including critical care time)  Medications Ordered in UC Medications  alum & mag hydroxide-simeth (MAALOX/MYLANTA) 200-200-20 MG/5ML suspension 30 mL (has no administration in time range)    Initial Impression / Assessment and Plan / UC Course  I have reviewed the triage vital signs and the nursing notes.  Pertinent labs & imaging results that were available during my care of the patient were reviewed by me and considered in my medical decision making (see chart for details).     1.  Acute superficial gastritis without hemorrhage: CBC, lipase Maalox/Mylanta x1 dose Protonix 20 mg orally daily Carafate before meals and at bedtime We will call patient with recommendations if labs are abnormal Return precautions given. Final Clinical Impressions(s) / UC Diagnoses   Final diagnoses:  Acute superficial gastritis without hemorrhage     Discharge Instructions      Please maintain adequate hydration Liquid diet and advance as tolerated Take medications as directed We will call you with recommendations if labs are abnormal If you continue to have abdominal pain in spite of medications, you will benefit from gastroenterology evaluation.    ED Prescriptions     Medication Sig Dispense Auth. Provider   pantoprazole (PROTONIX) 20 MG tablet Take 1 tablet (20 mg total)  by mouth  daily. 30 tablet Anushka Hartinger, Britta MccreedyPhilip O, MD   sucralfate (CARAFATE) 1 g tablet Take 1 tablet (1 g total) by mouth 4 (four) times daily -  with meals and at bedtime for 10 days. 40 tablet Annaly Skop, Britta MccreedyPhilip O, MD      PDMP not reviewed this encounter.   Merrilee JanskyLamptey, Krisinda Giovanni O, MD 04/16/22 0900

## 2022-04-16 NOTE — Discharge Instructions (Signed)
Please maintain adequate hydration Liquid diet and advance as tolerated Take medications as directed We will call you with recommendations if labs are abnormal If you continue to have abdominal pain in spite of medications, you will benefit from gastroenterology evaluation.

## 2022-04-16 NOTE — ED Triage Notes (Signed)
Pt c/o abd cramping onset Friday. Nausea, constipation, bloating.   Denies vomiting, diarrhea.   LBM this morning.

## 2022-04-17 LAB — CBC
Hematocrit: 42.3 % (ref 34.0–46.6)
Hemoglobin: 13.8 g/dL (ref 11.1–15.9)
MCH: 27.1 pg (ref 26.6–33.0)
MCHC: 32.6 g/dL (ref 31.5–35.7)
MCV: 83 fL (ref 79–97)
Platelets: 383 10*3/uL (ref 150–450)
RBC: 5.1 x10E6/uL (ref 3.77–5.28)
RDW: 12.9 % (ref 11.7–15.4)
WBC: 12.4 10*3/uL — ABNORMAL HIGH (ref 3.4–10.8)

## 2022-04-17 LAB — LIPASE: Lipase: 34 U/L (ref 14–72)

## 2022-05-26 ENCOUNTER — Other Ambulatory Visit: Payer: Self-pay | Admitting: Family

## 2022-05-26 DIAGNOSIS — K219 Gastro-esophageal reflux disease without esophagitis: Secondary | ICD-10-CM

## 2022-05-26 DIAGNOSIS — I10 Essential (primary) hypertension: Secondary | ICD-10-CM

## 2022-07-03 ENCOUNTER — Other Ambulatory Visit: Payer: Self-pay | Admitting: Family

## 2022-07-03 DIAGNOSIS — I10 Essential (primary) hypertension: Secondary | ICD-10-CM

## 2022-07-24 ENCOUNTER — Other Ambulatory Visit: Payer: Self-pay | Admitting: Family

## 2022-07-24 DIAGNOSIS — E785 Hyperlipidemia, unspecified: Secondary | ICD-10-CM

## 2022-07-24 DIAGNOSIS — F32A Depression, unspecified: Secondary | ICD-10-CM

## 2022-07-24 DIAGNOSIS — I10 Essential (primary) hypertension: Secondary | ICD-10-CM

## 2022-07-25 MED ORDER — LABETALOL HCL 100 MG PO TABS: 100.0000 mg | ORAL_TABLET | Freq: Two times a day (BID) | ORAL | 0 refills | Status: DC

## 2022-07-25 MED ORDER — FLUOXETINE HCL 10 MG PO CAPS: 10.0000 mg | ORAL_CAPSULE | Freq: Every day | ORAL | 0 refills | Status: DC

## 2022-07-25 MED ORDER — ATORVASTATIN CALCIUM 20 MG PO TABS
20.0000 mg | ORAL_TABLET | Freq: Every day | ORAL | 0 refills | Status: DC
Start: 2022-07-25 — End: 2022-08-21

## 2022-07-25 NOTE — Telephone Encounter (Signed)
Order complete. Call patient to make aware to schedule an appointment for additional refills.

## 2022-07-25 NOTE — Telephone Encounter (Signed)
Courtesy order complete. Call patient to make aware to schedule an appointment for additional refills.

## 2022-08-21 ENCOUNTER — Other Ambulatory Visit: Payer: Self-pay | Admitting: Family

## 2022-08-21 DIAGNOSIS — I10 Essential (primary) hypertension: Secondary | ICD-10-CM

## 2022-08-21 DIAGNOSIS — E785 Hyperlipidemia, unspecified: Secondary | ICD-10-CM

## 2022-08-21 NOTE — Telephone Encounter (Signed)
Att to contact pt to advise of provider message and schedule appt no ans lvm for pt to call office. -pt unable to have any further refills until appt is completed w/PCP

## 2022-08-21 NOTE — Telephone Encounter (Signed)
Call patient with update. Patient's last appointment 11/06/2021. Will refill Labetalol courtesy order. Make patient aware to make an appointment with me for additional refills. Office policy of an office visit at least every 6 months for medication refills. Patient currently at 9 months without an office visit.

## 2022-08-21 NOTE — Telephone Encounter (Signed)
Call patient with update. Patient's last appointment 11/06/2021. Will refill Labetalol courtesy order. Make patient aware to make an appointment with me for additional refills. Office policy of an office visit at least every 6 months for medication refills. Patient currently at 9 months without an office visit.

## 2022-08-21 NOTE — Telephone Encounter (Signed)
Requested Prescriptions  Pending Prescriptions Disp Refills  . atorvastatin (LIPITOR) 20 MG tablet [Pharmacy Med Name: ATORVASTATIN 20MG  TABLETS] 30 tablet 0    Sig: TAKE 1 TABLET(20 MG) BY MOUTH DAILY     Cardiovascular:  Antilipid - Statins Failed - 08/21/2022  8:53 AM      Failed - Lipid Panel in normal range within the last 12 months    Cholesterol, Total  Date Value Ref Range Status  11/06/2021 214 (H) 100 - 199 mg/dL Final   LDL Chol Calc (NIH)  Date Value Ref Range Status  11/06/2021 143 (H) 0 - 99 mg/dL Final   HDL  Date Value Ref Range Status  11/06/2021 37 (L) >39 mg/dL Final   Triglycerides  Date Value Ref Range Status  11/06/2021 189 (H) 0 - 149 mg/dL Final         Passed - Patient is not pregnant      Passed - Valid encounter within last 12 months    Recent Outpatient Visits          9 months ago Annual physical exam   Primary Care at [facility], [place], NP   11 months ago Female fertility problem   Primary Care at [facility], Kriste Basque, NP

## 2022-09-10 ENCOUNTER — Encounter: Payer: Self-pay | Admitting: Family

## 2022-09-11 ENCOUNTER — Emergency Department
Admission: EM | Admit: 2022-09-11 | Discharge: 2022-09-11 | Disposition: A | Discharge status: Home / Self Care | Payer: 59 | Attending: Emergency Medicine | Admitting: Emergency Medicine

## 2022-09-11 ENCOUNTER — Other Ambulatory Visit: Payer: Self-pay

## 2022-09-11 ENCOUNTER — Emergency Department: Payer: 59

## 2022-09-11 DIAGNOSIS — R0789 Other chest pain: Secondary | ICD-10-CM

## 2022-09-11 DIAGNOSIS — E785 Hyperlipidemia, unspecified: Secondary | ICD-10-CM

## 2022-09-11 DIAGNOSIS — R072 Precordial pain: Secondary | ICD-10-CM | POA: Diagnosis present

## 2022-09-11 DIAGNOSIS — K219 Gastro-esophageal reflux disease without esophagitis: Secondary | ICD-10-CM | POA: Diagnosis not present

## 2022-09-11 DIAGNOSIS — I10 Essential (primary) hypertension: Secondary | ICD-10-CM

## 2022-09-11 DIAGNOSIS — F32A Depression, unspecified: Secondary | ICD-10-CM

## 2022-09-11 DIAGNOSIS — F419 Anxiety disorder, unspecified: Secondary | ICD-10-CM

## 2022-09-11 LAB — CBC
HCT: 40 % (ref 36.0–46.0)
Hemoglobin: 13.4 g/dL (ref 12.0–15.0)
MCH: 27.2 pg (ref 26.0–34.0)
MCHC: 33.5 g/dL (ref 30.0–36.0)
MCV: 81.3 fL (ref 80.0–100.0)
Platelets: 396 10*3/uL (ref 150–400)
RBC: 4.92 MIL/uL (ref 3.87–5.11)
RDW: 13 % (ref 11.5–15.5)
WBC: 10.8 10*3/uL — ABNORMAL HIGH (ref 4.0–10.5)
nRBC: 0 % (ref 0.0–0.2)

## 2022-09-11 LAB — BASIC METABOLIC PANEL
Anion gap: 8 (ref 5–15)
BUN: 11 mg/dL (ref 6–20)
CO2: 23 mmol/L (ref 22–32)
Calcium: 8.9 mg/dL (ref 8.9–10.3)
Chloride: 106 mmol/L (ref 98–111)
Creatinine, Ser: 0.89 mg/dL (ref 0.44–1.00)
GFR, Estimated: >60 mL/min (ref >60)
Glucose, Bld: 126 mg/dL — ABNORMAL HIGH (ref 70–99)
Potassium: 3.3 mmol/L — ABNORMAL LOW (ref 3.5–5.1)
Sodium: 137 mmol/L (ref 135–145)

## 2022-09-11 LAB — TROPONIN I (HIGH SENSITIVITY): Troponin I (High Sensitivity): <2 ng/L (ref <18)

## 2022-09-11 MED ORDER — ATORVASTATIN CALCIUM 20 MG PO TABS: ORAL_TABLET | ORAL | 0 refills | Status: DC

## 2022-09-11 MED ORDER — LIDOCAINE VISCOUS HCL 2 % MT SOLN
15.0000 mL | Freq: Once | OROMUCOSAL | Status: AC
  Administered 2022-09-11: 15 mL via OROMUCOSAL
  Filled 2022-09-11: qty 15

## 2022-09-11 MED ORDER — LABETALOL HCL 100 MG PO TABS: ORAL_TABLET | ORAL | 0 refills | Status: DC

## 2022-09-11 MED ORDER — PANTOPRAZOLE SODIUM 20 MG PO TBEC: 20.0000 mg | DELAYED_RELEASE_TABLET | Freq: Every day | ORAL | 1 refills | Status: DC

## 2022-09-11 MED ORDER — SUCRALFATE 1 GM/10ML PO SUSP: 1.0000 g | Freq: Four times a day (QID) | ORAL | 1 refills | Status: DC

## 2022-09-11 MED ORDER — FLUOXETINE HCL 10 MG PO CAPS: 10.0000 mg | ORAL_CAPSULE | Freq: Every day | ORAL | 0 refills | Status: DC

## 2022-09-11 MED ORDER — ALUM & MAG HYDROXIDE-SIMETH 200-200-20 MG/5ML PO SUSP
30.0000 mL | Freq: Once | ORAL | Status: AC
  Administered 2022-09-11: 30 mL via ORAL
  Filled 2022-09-11: qty 30

## 2022-09-11 NOTE — ED Triage Notes (Signed)
Pt to ED via POV from home. Pt reports she had a burning sensation 1 week ago and states this past Monday CP came back as sharp and radiates from left breast to shoulder and jaw. Pt reports hx anxiety, HTN and reflux.

## 2022-09-11 NOTE — ED Provider Notes (Signed)
[facility] Provider Note   Event Date/Time   First MD Initiated Contact with Patient 09/11/22 2200     (approximate) History  Chest Pain  HPI Gina Mcdowell is a 36 y.o. female with a stated past medical history of GERD who presents for burning substernal chest pain that has only recently over the last 24 hours radiated the left chest as well as left upper extremity and left jaw.  Patient states that the symptoms have never occurred in the past but she does state that she had a "spicy hotdog" before the symptoms began.  Patient denies any exertional worsening for this pain.  Patient denies any pleuritic aspect of this pain.  Patient denies any family history of early cardiac disease ROS: Patient currently denies any vision changes, tinnitus, difficulty speaking, facial droop, sore throat, shortness of breath, abdominal pain, nausea/vomiting/diarrhea, dysuria, or weakness/numbness/paresthesias in any extremity   Physical Exam  Triage Vital Signs: ED Triage Vitals  Enc Vitals Group     BP 09/11/22 1825 (!) 170/98     Pulse Rate 09/11/22 1825 100     Resp 09/11/22 1825 18     Temp 09/11/22 1825 98 F (36.7 C)     Temp Source 09/11/22 1825 Oral     SpO2 09/11/22 1825 98 %     Weight 09/11/22 1823 190 lb (86.2 kg)     Height 09/11/22 1823 5\' 3"  (1.6 m)     Head Circumference --      Peak Flow --      Pain Score 09/11/22 1823 5     Pain Loc --      Pain Edu? --      Excl. in GC? --    Most recent vital signs: Vitals:   09/11/22 2200 09/11/22 2239  BP: (!) 156/97   Pulse: 90   Resp: (!) 22   Temp:  98.5 F (36.9 C)  SpO2: 100%    General: Awake, oriented x4. CV:  Good peripheral perfusion.  Resp:  Normal effort.  Abd:  No distention.  Other:  Middle-aged obese African-American female laying in bed in no acute distress ED Results / Procedures / Treatments  Labs (all labs ordered are listed, but only abnormal results are displayed) Labs Reviewed   BASIC METABOLIC PANEL - Abnormal; Notable for the following components:      Result Value   Potassium 3.3 (*)    Glucose, Bld 126 (*)    All other components within normal limits  CBC - Abnormal; Notable for the following components:   WBC 10.8 (*)    All other components within normal limits  POC URINE PREG, ED  TROPONIN I (HIGH SENSITIVITY)   EKG ED ECG REPORT I, 13/09/23, the attending physician, personally viewed and interpreted this ECG. Date: 09/11/2022 EKG Time: 1825 Rate: 88 Rhythm: normal sinus rhythm QRS Axis: normal Intervals: normal ST/T Wave abnormalities: normal Narrative Interpretation: no evidence of acute ischemia RADIOLOGY ED MD interpretation: 2 view chest x-ray interpreted by me shows no evidence of acute abnormalities including no pneumonia, pneumothorax, or widened mediastinum -Agree with radiology assessment Official radiology report(s): DG Chest 2 View  Result Date: 09/11/2022 CLINICAL DATA:  Left-sided chest pain since Monday. EXAM: CHEST - 2 VIEW COMPARISON:  Chest radiograph September 06, 2018 FINDINGS: The heart size and mediastinal contours are within normal limits. No focal consolidation. No pleural effusion. No pneumothorax. The visualized skeletal structures are unremarkable. IMPRESSION: No acute cardiopulmonary  disease. Electronically Signed   By: Maudry Mayhew M.D.   On: 09/11/2022 18:59   PROCEDURES: Critical Care performed: No .1-3 Lead EKG Interpretation  Performed by: Merwyn Katos, MD Authorized by: Merwyn Katos, MD     Interpretation: normal     ECG rate:  91   ECG rate assessment: normal     Rhythm: sinus rhythm     Ectopy: none     Conduction: normal    MEDICATIONS ORDERED IN ED: Medications  alum & mag hydroxide-simeth (MAALOX/MYLANTA) 200-200-20 MG/5ML suspension 30 mL (30 mLs Oral Given 09/11/22 2238)  lidocaine (XYLOCAINE) 2 % viscous mouth solution 15 mL (15 mLs Mouth/Throat Given 09/11/22 2238)   IMPRESSION / MDM  / ASSESSMENT AND PLAN / ED COURSE  I reviewed the triage vital signs and the nursing notes.                             The patient is on the cardiac monitor to evaluate for evidence of arrhythmia and/or significant heart rate changes. Patient's presentation is most consistent with acute presentation with potential threat to life or bodily function. Workup: ECG, CXR, CBC, BMP, Troponin Findings: ECG: No overt evidence of STEMI. No evidence of Brugadas sign, delta wave, epsilon wave, significantly prolonged QTc, or malignant arrhythmia HS Troponin: Negative x1 Other Labs unremarkable for emergent problems. CXR: Without PTX, PNA, or widened mediastinum Last Stress Test: Never Last Heart Catheterization: Never HEART Score: 2  Given History, Exam, and Workup I have low suspicion for ACS, Pneumothorax, Pneumonia, Pulmonary Embolus, Tamponade, Aortic Dissection or other emergent problem as a cause for this presentation.   Reassesment: Prior to discharge patients pain was controlled and they were well appearing.  Disposition:  Discharge. Strict return precautions discussed with patient with full understanding. Advised patient to follow up promptly with primary care provider    FINAL CLINICAL IMPRESSION(S) / ED DIAGNOSES   Final diagnoses:  Burning chest pain  Gastroesophageal reflux disease, unspecified whether esophagitis present   Rx / DC Orders   ED Discharge Orders          Ordered    pantoprazole (PROTONIX) 20 MG tablet  Daily        09/11/22 2222    labetalol (NORMODYNE) 100 MG tablet       Note to Pharmacy: Patient needs an office visit for additional refills.   09/11/22 2222    FLUoxetine (PROZAC) 10 MG capsule  Daily        09/11/22 2222    atorvastatin (LIPITOR) 20 MG tablet        09/11/22 2222    sucralfate (CARAFATE) 1 GM/10ML suspension  4 times daily        09/11/22 2222           Note:  This document was prepared using Dragon voice recognition software  and may include unintentional dictation errors.   Merwyn Katos, MD 09/11/22 [phone]

## 2022-12-03 ENCOUNTER — Other Ambulatory Visit: Payer: Self-pay | Admitting: Family

## 2022-12-03 ENCOUNTER — Encounter: Payer: Self-pay | Admitting: Family

## 2022-12-03 DIAGNOSIS — R110 Nausea: Secondary | ICD-10-CM

## 2022-12-03 MED ORDER — ONDANSETRON 4 MG PO TBDP: 4.0000 mg | ORAL_TABLET | Freq: Three times a day (TID) | ORAL | 1 refills | Status: DC | PRN

## 2022-12-03 NOTE — Telephone Encounter (Signed)
Patient's last appointment 11/06/2021. Make patient aware to schedule an appointment with me for blood pressure medication refills. Our office policy is that patient will need an office visit at least every 6 months for medication refills. Courtesy order for nausea medication complete.

## 2022-12-03 NOTE — Telephone Encounter (Signed)
I have attempted without success to contact this patient by phone to I left a message on answering machine.

## 2022-12-31 NOTE — Progress Notes (Deleted)
Patient ID: Gina Mcdowell, female    DOB: [birth year]  MRN: [id number]  CC: Annual Physical Exam  Subjective: Gina Mcdowell is a 37 y.o. female who presents for annual physical exam.   Her concerns today include:  HTN - Labetalol HLD - Atorvastatin   Patient Active Problem List   Diagnosis Date Noted   Infertility counseling 01/15/2022   Anxiety and depression 12/16/2021   Hyperlipidemia 11/07/2021   Sleep disorder breathing 12/08/2019   Headache disorder 10/26/2018   Essential hypertension 10/05/2018   GERD (gastroesophageal reflux disease) 07/28/2018   Proteinuria 04/07/2014     Current Outpatient Medications on File Prior to Visit  Medication Sig Dispense Refill   atorvastatin (LIPITOR) 20 MG tablet TAKE 1 TABLET(20 MG) BY MOUTH DAILY 90 tablet 0   FLUoxetine (PROZAC) 10 MG capsule Take 1 capsule (10 mg total) by mouth daily. 30 capsule 0   labetalol (NORMODYNE) 100 MG tablet TAKE 1 TABLET(100 MG) BY MOUTH TWICE DAILY 60 tablet 0   ondansetron (ZOFRAN-ODT) 4 MG disintegrating tablet Take 1 tablet (4 mg total) by mouth every 8 (eight) hours as needed for nausea or vomiting. 30 tablet 1   pantoprazole (PROTONIX) 20 MG tablet Take 1 tablet (20 mg total) by mouth daily. 30 tablet 1   sucralfate (CARAFATE) 1 g tablet Take 1 tablet (1 g total) by mouth 4 (four) times daily -  with meals and at bedtime for 10 days. 40 tablet 0   sucralfate (CARAFATE) 1 GM/10ML suspension Take 10 mLs (1 g total) by mouth 4 (four) times daily. 420 mL 1   No current facility-administered medications on file prior to visit.    No Known Allergies  Social History   Socioeconomic History   Marital status: Married    Spouse name: Not on file   Number of children: Not on file   Years of education: Not on file   Highest education level: Not on file  Occupational History   Not on file  Tobacco Use   Smoking status: Never   Smokeless tobacco: Never  Substance and Sexual Activity   Alcohol  use: Yes    Alcohol/week: 2.0 - 3.0 standard drinks of alcohol    Types: 2 - 3 Glasses of wine per week   Drug use: No   Sexual activity: Yes    Birth control/protection: None  Other Topics Concern   Not on file  Social History Narrative   ** Merged History Encounter **       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   Associates in medical billing/coding   Social Determinants of Health   Financial Resource Strain: Not on file  Food Insecurity: Not on file  Transportation Needs: Not on file  Physical Activity: Not on file  Stress: Not on file  Social Connections: Not on file  Intimate Partner Violence: Not on file    Family History  Problem Relation Age of Onset   Hypertension Mother    Hyperlipidemia Father    Hypertension Maternal Grandmother    Diabetes Paternal Grandmother    Cancer Paternal Grandmother        skin cancer melanoma   Cancer Paternal Grandfather        prostate   Cancer Other  colon and breast    No past surgical history on file.  ROS: Review of Systems Negative except as stated above  PHYSICAL EXAM: There were no vitals taken for this visit.  Physical Exam  {female adult master:310786} {female adult master:310785}     Latest Ref Rng & Units 09/11/2022    6:26 PM 11/06/2021    5:02 PM 09/06/2018   10:04 PM  CMP  Glucose 70 - 99 mg/dL 126  86  92   BUN 6 - 20 mg/dL '11  9  6   '$ Creatinine 0.44 - 1.00 mg/dL 0.89  0.93  1.07   Sodium 135 - 145 mmol/L 137  136  135   Potassium 3.5 - 5.1 mmol/L 3.3  4.1  3.3   Chloride 98 - 111 mmol/L 106  102  104   CO2 22 - 32 mmol/L '23  24  24   '$ Calcium 8.9 - 10.3 mg/dL 8.9  9.2  8.9   Total Protein 6.0 - 8.5 g/dL  6.6    Total Bilirubin 0.0 - 1.2 mg/dL  0.4    Alkaline Phos 44 - 121 IU/L  71    AST 0 - 40 IU/L  15    ALT 0 - 32 IU/L  7     Lipid  Panel     Component Value Date/Time   CHOL 214 (H) 11/06/2021 1702   TRIG 189 (H) 11/06/2021 1702   HDL 37 (L) 11/06/2021 1702   CHOLHDL 5.8 (H) 11/06/2021 1702   CHOLHDL 4 11/23/2015 0735   VLDL 20.8 11/23/2015 0735   LDLCALC 143 (H) 11/06/2021 1702    CBC    Component Value Date/Time   WBC 10.8 (H) 09/11/2022 1826   RBC 4.92 09/11/2022 1826   HGB 13.4 09/11/2022 1826   HGB 13.8 04/16/2022 0835   HCT 40.0 09/11/2022 1826   HCT 42.3 04/16/2022 0835   PLT 396 09/11/2022 1826   PLT 383 04/16/2022 0835   MCV 81.3 09/11/2022 1826   MCV 83 04/16/2022 0835   MCH 27.2 09/11/2022 1826   MCHC 33.5 09/11/2022 1826   RDW 13.0 09/11/2022 1826   RDW 12.9 04/16/2022 0835   LYMPHSABS 3.1 11/23/2015 0735   MONOABS 0.7 11/23/2015 0735   EOSABS 0.1 11/23/2015 0735   BASOSABS 0.0 11/23/2015 0735    ASSESSMENT AND PLAN:  There are no diagnoses linked to this encounter.   Patient was given the opportunity to ask questions.  Patient verbalized understanding of the plan and was able to repeat key elements of the plan. Patient was given clear instructions to go to Emergency Department or return to medical center if symptoms don't improve, worsen, or new problems develop.The patient verbalized understanding.   No orders of the defined types were placed in this encounter.    Requested Prescriptions    No prescriptions requested or ordered in this encounter    No follow-ups on file.  Camillia Herter, NP

## 2023-01-05 ENCOUNTER — Encounter: Payer: 59 | Admitting: Family

## 2023-01-14 NOTE — Progress Notes (Unsigned)
  Patient ID: Gina Mcdowell, female    DOB: 03/15/1986  MRN: [id number]  CC: Annual Physical Exam  Subjective: Gina Mcdowell is a 36 y.o. female who presents for annual physical exam.   Her concerns today include:  HTN - Labetalol HLD - Atorvastatin   Patient Active Problem List   Diagnosis Date Noted   Infertility counseling 01/15/2022   Anxiety and depression 12/16/2021   Hyperlipidemia 11/07/2021   Sleep disorder breathing 12/08/2019   Headache disorder 10/26/2018   Essential hypertension 10/05/2018   GERD (gastroesophageal reflux disease) 07/28/2018   Proteinuria 04/07/2014     Current Outpatient Medications on File Prior to Visit  Medication Sig Dispense Refill   atorvastatin (LIPITOR) 20 MG tablet TAKE 1 TABLET(20 MG) BY MOUTH DAILY 90 tablet 0   FLUoxetine (PROZAC) 10 MG capsule Take 1 capsule (10 mg total) by mouth daily. 30 capsule 0   labetalol (NORMODYNE) 100 MG tablet TAKE 1 TABLET(100 MG) BY MOUTH TWICE DAILY 60 tablet 0   ondansetron (ZOFRAN-ODT) 4 MG disintegrating tablet Take 1 tablet (4 mg total) by mouth every 8 (eight) hours as needed for nausea or vomiting. 30 tablet 1   pantoprazole (PROTONIX) 20 MG tablet Take 1 tablet (20 mg total) by mouth daily. 30 tablet 1   sucralfate (CARAFATE) 1 g tablet Take 1 tablet (1 g total) by mouth 4 (four) times daily -  with meals and at bedtime for 10 days. 40 tablet 0   sucralfate (CARAFATE) 1 GM/10ML suspension Take 10 mLs (1 g total) by mouth 4 (four) times daily. 420 mL 1   No current facility-administered medications on file prior to visit.    No Known Allergies  Social History   Socioeconomic History   Marital status: Married    Spouse name: Not on file   Number of children: Not on file   Years of education: Not on file   Highest education level: Not on file  Occupational History   Not on file  Tobacco Use   Smoking status: Never   Smokeless tobacco: Never  Substance and Sexual Activity   Alcohol  use: Yes    Alcohol/week: 2.0 - 3.0 standard drinks of alcohol    Types: 2 - 3 Glasses of wine per week   Drug use: No   Sexual activity: Yes    Birth control/protection: None  Other Topics Concern   Not on file  Social History Narrative   ** Merged History Encounter **       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   Associates in medical billing/coding   Social Determinants of Health   Financial Resource Strain: Not on file  Food Insecurity: Not on file  Transportation Needs: Not on file  Physical Activity: Not on file  Stress: Not on file  Social Connections: Not on file  Intimate Partner Violence: Not on file    Family History  Problem Relation Age of Onset   Hypertension Mother    Hyperlipidemia Father    Hypertension Maternal Grandmother    Diabetes Paternal Grandmother    Cancer Paternal Grandmother        skin cancer melanoma   Cancer Paternal Grandfather        prostate   Cancer Other          colon and breast    No past surgical history on file.  ROS: Review of Systems Negative except as stated above  PHYSICAL EXAM: There were no vitals taken for this visit.  Physical Exam  {female adult master:310786} {female adult master:310785}     Latest Ref Rng & Units 09/11/2022    6:26 PM 11/06/2021    5:02 PM 09/06/2018   10:04 PM  CMP  Glucose 70 - 99 mg/dL 126  86  92   BUN 6 - 20 mg/dL 11  9  6   Creatinine 0.44 - 1.00 mg/dL 0.89  0.93  1.07   Sodium 135 - 145 mmol/L 137  136  135   Potassium 3.5 - 5.1 mmol/L 3.3  4.1  3.3   Chloride 98 - 111 mmol/L 106  102  104   CO2 22 - 32 mmol/L 23  24  24   Calcium 8.9 - 10.3 mg/dL 8.9  9.2  8.9   Total Protein 6.0 - 8.5 g/dL  6.6    Total Bilirubin 0.0 - 1.2 mg/dL  0.4    Alkaline Phos 44 - 121 IU/L  71    AST 0 - 40 IU/L  15    ALT 0 - 32 IU/L  7     Lipid  Panel     Component Value Date/Time   CHOL 214 (H) 11/06/2021 1702   TRIG 189 (H) 11/06/2021 1702   HDL 37 (L) 11/06/2021 1702   CHOLHDL 5.8 (H) 11/06/2021 1702   CHOLHDL 4 11/23/2015 0735   VLDL 20.8 11/23/2015 0735   LDLCALC 143 (H) 11/06/2021 1702    CBC    Component Value Date/Time   WBC 10.8 (H) 09/11/2022 1826   RBC 4.92 09/11/2022 1826   HGB 13.4 09/11/2022 1826   HGB 13.8 04/16/2022 0835   HCT 40.0 09/11/2022 1826   HCT 42.3 04/16/2022 0835   PLT 396 09/11/2022 1826   PLT 383 04/16/2022 0835   MCV 81.3 09/11/2022 1826   MCV 83 04/16/2022 0835   MCH 27.2 09/11/2022 1826   MCHC 33.5 09/11/2022 1826   RDW 13.0 09/11/2022 1826   RDW 12.9 04/16/2022 0835   LYMPHSABS 3.1 11/23/2015 0735   MONOABS 0.7 11/23/2015 0735   EOSABS 0.1 11/23/2015 0735   BASOSABS 0.0 11/23/2015 0735    ASSESSMENT AND PLAN:  There are no diagnoses linked to this encounter.   Patient was given the opportunity to ask questions.  Patient verbalized understanding of the plan and was able to repeat key elements of the plan. Patient was given clear instructions to go to Emergency Department or return to medical center if symptoms don't improve, worsen, or new problems develop.The patient verbalized understanding.   No orders of the defined types were placed in this encounter.    Requested Prescriptions    No prescriptions requested or ordered in this encounter    No follow-ups on file.  Eleaner Dibartolo J Kaye Mitro, NP  

## 2023-01-15 ENCOUNTER — Encounter: Payer: Self-pay | Admitting: Family

## 2023-01-15 ENCOUNTER — Ambulatory Visit (INDEPENDENT_AMBULATORY_CARE_PROVIDER_SITE_OTHER): Payer: 59 | Admitting: Family

## 2023-01-15 VITALS — BP 126/85 | HR 86 | Temp 98.3°F | Resp 16 | Ht 62.99 in | Wt 189.0 lb

## 2023-01-15 DIAGNOSIS — F32A Depression, unspecified: Secondary | ICD-10-CM

## 2023-01-15 DIAGNOSIS — Z131 Encounter for screening for diabetes mellitus: Secondary | ICD-10-CM

## 2023-01-15 DIAGNOSIS — Z13228 Encounter for screening for other metabolic disorders: Secondary | ICD-10-CM

## 2023-01-15 DIAGNOSIS — Z130 Encounter for screening for diseases of the blood and blood-forming organs and certain disorders involving the immune mechanism: Secondary | ICD-10-CM

## 2023-01-15 DIAGNOSIS — Z0000 Encounter for general adult medical examination without abnormal findings: Secondary | ICD-10-CM | POA: Diagnosis not present

## 2023-01-15 DIAGNOSIS — Z1329 Encounter for screening for other suspected endocrine disorder: Secondary | ICD-10-CM | POA: Diagnosis not present

## 2023-01-15 DIAGNOSIS — E785 Hyperlipidemia, unspecified: Secondary | ICD-10-CM

## 2023-01-15 DIAGNOSIS — F419 Anxiety disorder, unspecified: Secondary | ICD-10-CM

## 2023-01-15 DIAGNOSIS — I10 Essential (primary) hypertension: Secondary | ICD-10-CM

## 2023-01-15 MED ORDER — LABETALOL HCL 100 MG PO TABS: ORAL_TABLET | ORAL | 2 refills | Status: DC

## 2023-01-15 MED ORDER — PANTOPRAZOLE SODIUM 40 MG PO TBEC: 40.0000 mg | DELAYED_RELEASE_TABLET | Freq: Every day | ORAL | 0 refills | Status: DC

## 2023-01-15 MED ORDER — FLUOXETINE HCL 10 MG PO CAPS: 10.0000 mg | ORAL_CAPSULE | Freq: Every day | ORAL | 2 refills | Status: AC

## 2023-01-15 MED ORDER — ATORVASTATIN CALCIUM 20 MG PO TABS: ORAL_TABLET | ORAL | 0 refills | Status: DC

## 2023-01-15 NOTE — Progress Notes (Signed)
.  Pt presents for annual physical exam   -pt states that she has been off BP meds for about a month and today's BP looks fine wants to know how she can stop her BP med  -needs refill on Atorvastatin, Fluoxetine, Labetalol, and Pantoprazole

## 2023-01-15 NOTE — Patient Instructions (Signed)

## 2023-01-16 LAB — CBC
Hematocrit: 42.7 % (ref 34.0–46.6)
Hemoglobin: 13.9 g/dL (ref 11.1–15.9)
MCH: 27.9 pg (ref 26.6–33.0)
MCHC: 32.6 g/dL (ref 31.5–35.7)
MCV: 86 fL (ref 79–97)
Platelets: 373 10*3/uL (ref 150–450)
RBC: 4.99 x10E6/uL (ref 3.77–5.28)
RDW: 13 % (ref 11.7–15.4)
WBC: 6.2 10*3/uL (ref 3.4–10.8)

## 2023-01-16 LAB — LIPID PANEL
Chol/HDL Ratio: 3.8 ratio (ref 0.0–4.4)
Cholesterol, Total: 146 mg/dL (ref 100–199)
HDL: 38 mg/dL — ABNORMAL LOW (ref >39)
LDL Chol Calc (NIH): 85 mg/dL (ref 0–99)
Triglycerides: 127 mg/dL (ref 0–149)
VLDL Cholesterol Cal: 23 mg/dL (ref 5–40)

## 2023-01-16 LAB — CMP14+EGFR
ALT: 10 IU/L (ref 0–32)
AST: 19 IU/L (ref 0–40)
Albumin/Globulin Ratio: 1.8 (ref 1.2–2.2)
Albumin: 4.2 g/dL (ref 3.9–4.9)
Alkaline Phosphatase: 66 IU/L (ref 44–121)
BUN/Creatinine Ratio: 13 (ref 9–23)
BUN: 14 mg/dL (ref 6–20)
Bilirubin Total: 0.6 mg/dL (ref 0.0–1.2)
CO2: 23 mmol/L (ref 20–29)
Calcium: 9.1 mg/dL (ref 8.7–10.2)
Chloride: 101 mmol/L (ref 96–106)
Creatinine, Ser: 1.09 mg/dL — ABNORMAL HIGH (ref 0.57–1.00)
Globulin, Total: 2.4 g/dL (ref 1.5–4.5)
Glucose: 83 mg/dL (ref 70–99)
Potassium: 4.1 mmol/L (ref 3.5–5.2)
Sodium: 138 mmol/L (ref 134–144)
Total Protein: 6.6 g/dL (ref 6.0–8.5)
eGFR: 68 mL/min/{1.73_m2} (ref >59)

## 2023-01-16 LAB — FOLLICLE STIMULATING HORMONE: FSH: 3 m[IU]/mL

## 2023-01-16 LAB — TSH: TSH: 1.91 u[IU]/mL (ref 0.450–4.500)

## 2023-01-16 LAB — LUTEINIZING HORMONE: LH: 8.4 m[IU]/mL

## 2023-01-16 LAB — HEMOGLOBIN A1C
Est. average glucose Bld gHb Est-mCnc: 105 mg/dL
Hgb A1c MFr Bld: 5.3 % (ref 4.8–5.6)

## 2023-02-09 NOTE — Progress Notes (Deleted)
Patient ID: Gina Mcdowell, female    DOB: 11/22/1985  MRN: [id number]  CC: Blood Pressure Check  Subjective: Gina Mcdowell is a 37 y.o. female who presents for blood pressure check.   Her concerns today include:  Labetalol PRN   Patient Active Problem List   Diagnosis Date Noted   Infertility counseling 01/15/2022   Anxiety and depression 12/16/2021   Hyperlipidemia 11/07/2021   Apneic episode 12/08/2019   Sleep disorder breathing 12/08/2019   Dysfunction of right eustachian tube 11/11/2018   Headache disorder 10/26/2018   Referred otalgia of right ear 10/26/2018   Atypical chest pain 10/05/2018   Essential hypertension 10/05/2018   GERD (gastroesophageal reflux disease) 07/28/2018   Proteinuria 04/07/2014     Current Outpatient Medications on File Prior to Visit  Medication Sig Dispense Refill   atorvastatin (LIPITOR) 20 MG tablet TAKE 1 TABLET(20 MG) BY MOUTH DAILY 90 tablet 0   FLUoxetine (PROZAC) 10 MG capsule Take 1 capsule (10 mg total) by mouth daily. 30 capsule 2   labetalol (NORMODYNE) 100 MG tablet TAKE 1 TABLET(100 MG) BY MOUTH TWICE DAILY 60 tablet 2   ondansetron (ZOFRAN-ODT) 4 MG disintegrating tablet Take 1 tablet (4 mg total) by mouth every 8 (eight) hours as needed for nausea or vomiting. 30 tablet 1   pantoprazole (PROTONIX) 40 MG tablet Take 1 tablet (40 mg total) by mouth daily. 90 tablet 0   No current facility-administered medications on file prior to visit.    No Known Allergies  Social History   Socioeconomic History   Marital status: Married    Spouse name: Not on file   Number of children: Not on file   Years of education: Not on file   Highest education level: Not on file  Occupational History   Not on file  Tobacco Use   Smoking status: Never    Passive exposure: Never   Smokeless tobacco: Never  Substance and Sexual Activity   Alcohol use: Yes    Alcohol/week: 2.0 - 3.0 standard drinks of alcohol    Types: 2 - 3 Glasses of  wine per week   Drug use: No   Sexual activity: Yes    Birth control/protection: None  Other Topics Concern   Not on file  Social History Narrative   ** Merged History Encounter **       Works as Recruitment consultant at [employer], part time [employer]   Single   No children   No pets   Likes sleeping,speding time with family   She has 2 brothers and 2 sisters- all local.   Associates in medical billing/coding   Social Determinants of Health   Financial Resource Strain: Not on file  Food Insecurity: Not on file  Transportation Needs: Not on file  Physical Activity: Not on file  Stress: Not on file  Social Connections: Not on file  Intimate Partner Violence: Not on file    Family History  Problem Relation Age of Onset   Hypertension Mother    Hyperlipidemia Father    Hypertension Maternal Grandmother    Diabetes Paternal Grandmother    Cancer Paternal Grandmother        skin cancer melanoma   Cancer Paternal Grandfather        prostate   Cancer Other        colon and breast    No past surgical history on file.  ROS: Review of Systems Negative except as stated above  PHYSICAL  EXAM: There were no vitals taken for this visit.  Physical Exam  {female adult master:310786} {female adult master:310785}     Latest Ref Rng & Units 01/15/2023   12:00 AM 09/11/2022    6:26 PM 11/06/2021    5:02 PM  CMP  Glucose 70 - 99 mg/dL 83  973  86   BUN 6 - 20 mg/dL 14  11  9    Creatinine 0.57 - 1.00 mg/dL 5.32  9.92  4.26   Sodium 134 - 144 mmol/L 138  137  136   Potassium 3.5 - 5.2 mmol/L 4.1  3.3  4.1   Chloride 96 - 106 mmol/L 101  106  102   CO2 20 - 29 mmol/L 23  23  24    Calcium 8.7 - 10.2 mg/dL 9.1  8.9  9.2   Total Protein 6.0 - 8.5 g/dL 6.6   6.6   Total Bilirubin 0.0 - 1.2 mg/dL 0.6   0.4   Alkaline Phos 44 - 121 IU/L 66   71   AST 0 - 40 IU/L 19   15   ALT 0 - 32 IU/L 10   7    Lipid Panel     Component Value Date/Time   CHOL 146 01/15/2023 0000   TRIG 127  01/15/2023 0000   HDL 38 (L) 01/15/2023 0000   CHOLHDL 3.8 01/15/2023 0000   CHOLHDL 4 11/23/2015 0735   VLDL 20.8 11/23/2015 0735   LDLCALC 85 01/15/2023 0000    CBC    Component Value Date/Time   WBC 6.2 01/15/2023 0000   WBC 10.8 (H) 09/11/2022 1826   RBC 4.99 01/15/2023 0000   RBC 4.92 09/11/2022 1826   HGB 13.9 01/15/2023 0000   HCT 42.7 01/15/2023 0000   PLT 373 01/15/2023 0000   MCV 86 01/15/2023 0000   MCH 27.9 01/15/2023 0000   MCH 27.2 09/11/2022 1826   MCHC 32.6 01/15/2023 0000   MCHC 33.5 09/11/2022 1826   RDW 13.0 01/15/2023 0000   LYMPHSABS 3.1 11/23/2015 0735   MONOABS 0.7 11/23/2015 0735   EOSABS 0.1 11/23/2015 0735   BASOSABS 0.0 11/23/2015 0735    ASSESSMENT AND PLAN:  There are no diagnoses linked to this encounter.   Patient was given the opportunity to ask questions.  Patient verbalized understanding of the plan and was able to repeat key elements of the plan. Patient was given clear instructions to go to Emergency Department or return to medical center if symptoms don't improve, worsen, or new problems develop.The patient verbalized understanding.   No orders of the defined types were placed in this encounter.    Requested Prescriptions    No prescriptions requested or ordered in this encounter    No follow-ups on file.  Rema Fendt, NP

## 2023-02-16 ENCOUNTER — Ambulatory Visit: Payer: 59 | Admitting: Family

## 2023-02-16 DIAGNOSIS — I10 Essential (primary) hypertension: Secondary | ICD-10-CM

## 2023-03-19 ENCOUNTER — Other Ambulatory Visit: Payer: Self-pay | Admitting: *Deleted

## 2023-03-19 MED ORDER — PANTOPRAZOLE SODIUM 40 MG PO TBEC: 40.0000 mg | DELAYED_RELEASE_TABLET | Freq: Every day | ORAL | 0 refills | Status: AC

## 2023-06-04 ENCOUNTER — Other Ambulatory Visit: Payer: Self-pay | Admitting: Family

## 2023-06-04 DIAGNOSIS — I10 Essential (primary) hypertension: Secondary | ICD-10-CM

## 2023-06-21 ENCOUNTER — Other Ambulatory Visit: Payer: Self-pay | Admitting: Family

## 2023-06-21 DIAGNOSIS — E785 Hyperlipidemia, unspecified: Secondary | ICD-10-CM

## 2023-06-22 ENCOUNTER — Other Ambulatory Visit: Payer: Self-pay

## 2023-06-22 DIAGNOSIS — E785 Hyperlipidemia, unspecified: Secondary | ICD-10-CM

## 2023-06-22 MED ORDER — ATORVASTATIN CALCIUM 20 MG PO TABS: ORAL_TABLET | ORAL | 0 refills | Status: DC

## 2023-06-22 NOTE — Telephone Encounter (Signed)
Rx refill sent in @10am  06/22/2023

## 2023-07-16 ENCOUNTER — Other Ambulatory Visit: Payer: Self-pay | Admitting: Family

## 2023-07-16 DIAGNOSIS — I10 Essential (primary) hypertension: Secondary | ICD-10-CM

## 2023-09-18 ENCOUNTER — Telehealth: Payer: 59 | Admitting: Physician Assistant

## 2023-09-18 DIAGNOSIS — K219 Gastro-esophageal reflux disease without esophagitis: Secondary | ICD-10-CM

## 2023-09-18 MED ORDER — SUCRALFATE 1 GM/10ML PO SUSP: 1.0000 g | Freq: Three times a day (TID) | ORAL | 0 refills | Status: DC

## 2023-09-18 NOTE — Progress Notes (Signed)
E-Visit for Heartburn  We are sorry that you are not feeling well.  Here is how we plan to help!  Based on what you shared with me it looks like you most likely have Gastroesophageal Reflux Disease (GERD)  Gastroesophageal reflux disease (GERD) happens when acid from your stomach flows up into the esophagus.  When acid comes in contact with the esophagus, the acid causes sorenss (inflammation) in the esophagus.  Over time, GERD may create small holes (ulcers) in the lining of the esophagus.  Sucralfate 1gm/9mL Use 10mL before every meal and before bedtime, up to 4 times daily for 10 days.  Your symptoms should improve in the next day or two.  You can use antacids as needed until symptoms resolve.  Call us if your heartburn worsens, you have trouble swallowing, weight loss, spitting up blood or recurrent vomiting.  Home Care: May include lifestyle changes such as weight loss, quitting smoking and alcohol consumption Avoid foods and drinks that make your symptoms worse, such as: Caffeine or alcoholic drinks Chocolate Peppermint or mint flavorings Garlic and onions Spicy foods Citrus fruits, such as oranges, lemons, or limes Tomato-based foods such as sauce, chili, salsa and pizza Fried and fatty foods Avoid lying down for 3 hours prior to your bedtime or prior to taking a nap Eat small, frequent meals instead of a large meals Wear loose-fitting clothing.  Do not wear anything tight around your waist that causes pressure on your stomach. Raise the head of your bed 6 to 8 inches with wood blocks to help you sleep.  Extra pillows will not help.  Seek Help Right Away If: You have pain in your arms, neck, jaw, teeth or back Your pain increases or changes in intensity or duration You develop nausea, vomiting or sweating (diaphoresis) You develop shortness of breath or you faint Your vomit is green, yellow, black or looks like coffee grounds or blood Your stool is red, bloody or  black  These symptoms could be signs of other problems, such as heart disease, gastric bleeding or esophageal bleeding.  Make sure you : Understand these instructions. Will watch your condition. Will get help right away if you are not doing well or get worse.  Your e-visit answers were reviewed by a board certified advanced clinical practitioner to complete your personal care plan.  Depending on the condition, your plan could have included both over the counter or prescription medications.  If there is a problem please reply  once you have received a response from your provider.  Your safety is important to [place].  If you have drug allergies check your prescription carefully.    You can use MyChart to ask questions about today's visit, request a non-urgent call back, or ask for a work or school excuse for 24 hours related to this e-Visit. If it has been greater than 24 hours you will need to follow up with your provider, or enter a new e-Visit to address those concerns.  You will get an e-mail in the next two days asking about your experience.  I hope that your e-visit has been valuable and will speed your recovery. Thank you for using e-visits.   I have spent 5 minutes in review of e-visit questionnaire, review and updating patient chart, medical decision making and response to patient.   Margaretann Loveless, PA-C

## 2023-09-19 ENCOUNTER — Other Ambulatory Visit: Payer: Self-pay | Admitting: Family

## 2023-09-19 DIAGNOSIS — E785 Hyperlipidemia, unspecified: Secondary | ICD-10-CM

## 2023-09-21 ENCOUNTER — Telehealth: Payer: Self-pay | Admitting: Family

## 2023-09-21 NOTE — Telephone Encounter (Signed)
Called pt and left vm to call office back to schedule appt requested via mychart for pain in the neck and headaches

## 2023-09-21 NOTE — Telephone Encounter (Signed)
Requested Prescriptions  Pending Prescriptions Disp Refills   atorvastatin (LIPITOR) 20 MG tablet [Pharmacy Med Name: Atorvastatin Calcium 20 MG Oral Tablet] 90 tablet 1    Sig: TAKE 1 TABLET BY MOUTH DAILY     Cardiovascular:  Antilipid - Statins Failed - 09/19/2023  5:00 AM      Failed - Lipid Panel in normal range within the last 12 months    Cholesterol, Total  Date Value Ref Range Status  01/15/2023 146 100 - 199 mg/dL Final   LDL Chol Calc (NIH)  Date Value Ref Range Status  01/15/2023 85 0 - 99 mg/dL Final   HDL  Date Value Ref Range Status  01/15/2023 38 (L) >39 mg/dL Final   Triglycerides  Date Value Ref Range Status  01/15/2023 127 0 - 149 mg/dL Final         Passed - Patient is not pregnant      Passed - Valid encounter within last 12 months    Recent Outpatient Visits           8 months ago Annual physical exam   [facility], [place], [place]   1 year ago Annual physical exam   [place] [facility], [place], [place]   2 years ago Female fertility problem   [place] [facility], Gildardo Pounds, NP       Future Appointments             In 3 months Rema Fendt, NP [facility]

## 2023-11-12 IMAGING — RF DG HYSTEROGRAM
1 series · 9 of 9 positions shown · IV contrast (omnipaque)
Comparison: None Available.

CLINICAL DATA: Primary infertility.

EXAM:
HYSTEROSALPINGOGRAM
TECHNIQUE: Following cleansing of the cervix and vagina with Betadine solution,
a hysterosalpingogram was performed using a 5-French
hysterosalpingogram catheter and Omnipaque 300 contrast. The patient
tolerated the examination without difficulty.

[Series 1: one shot · 9 of 9 slices shown]
[im 1/9]
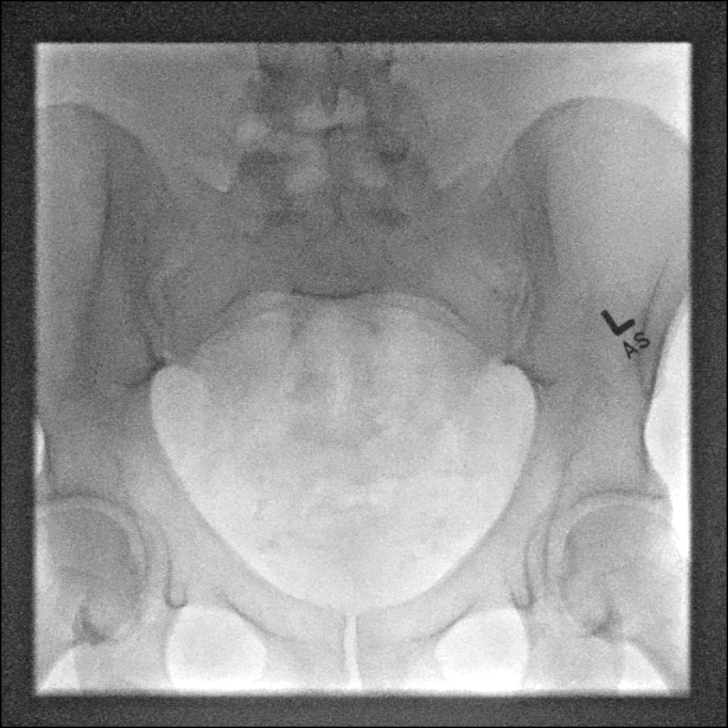
[im 2/9]
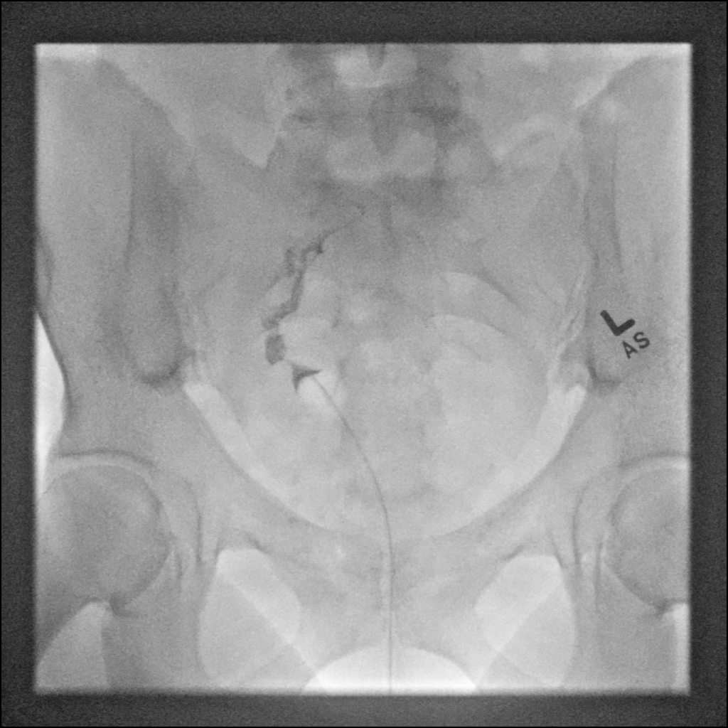
[im 3/9]
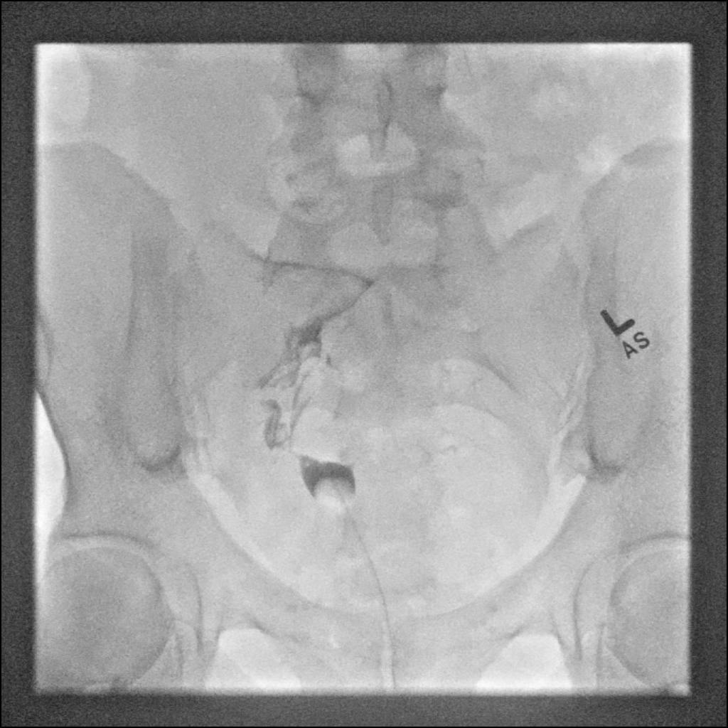
[im 4/9]
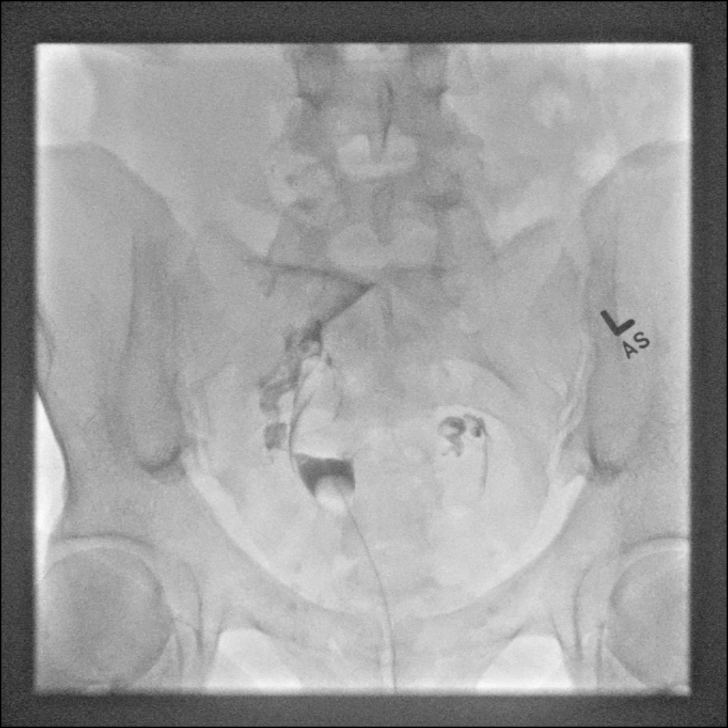
[im 5/9]
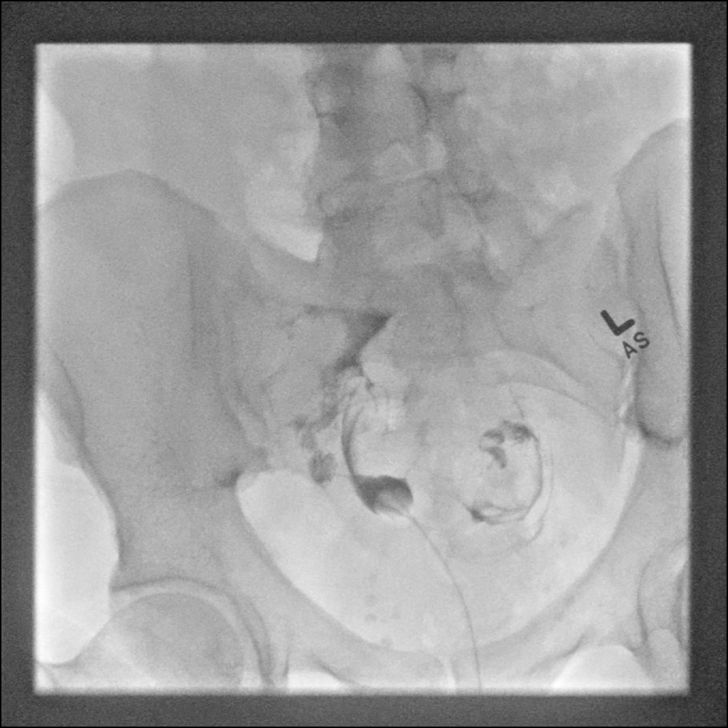
[im 6/9]
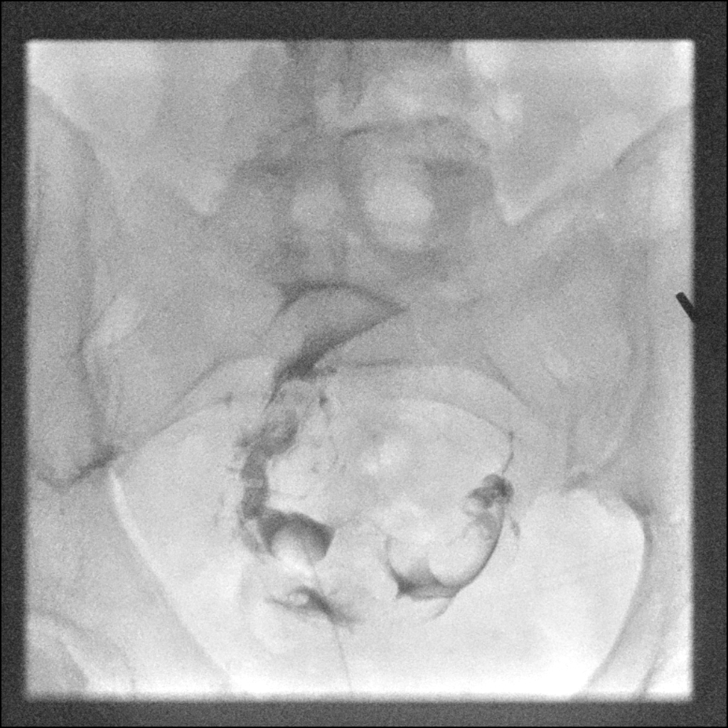
[im 7/9]
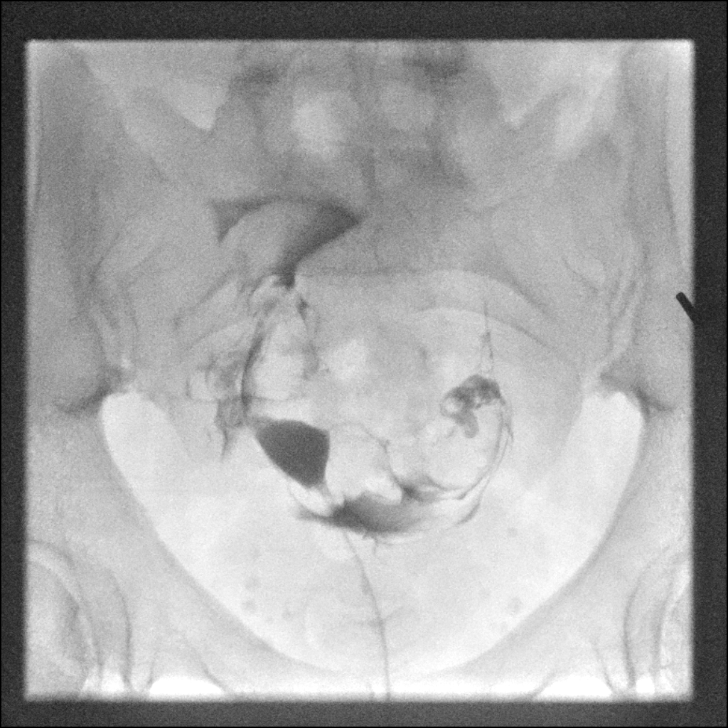
[im 8/9]
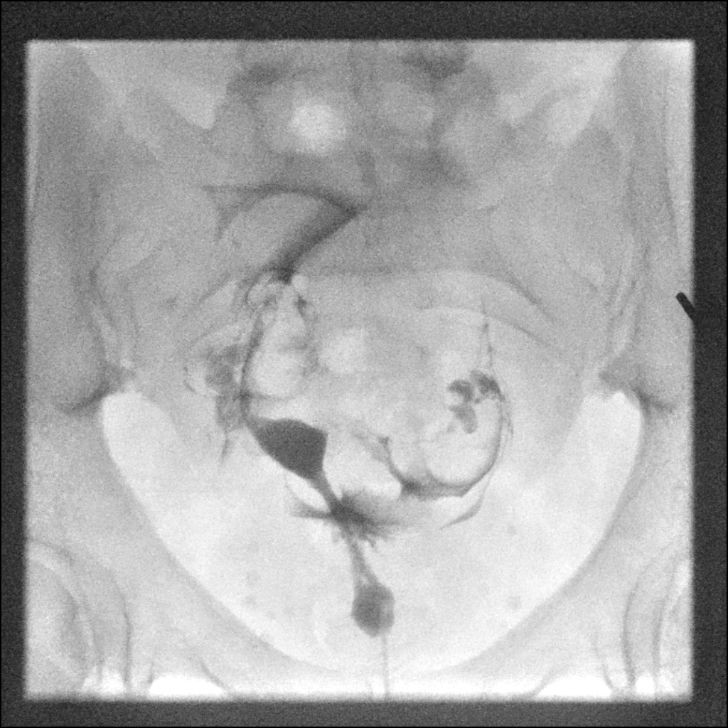
[im 9/9]
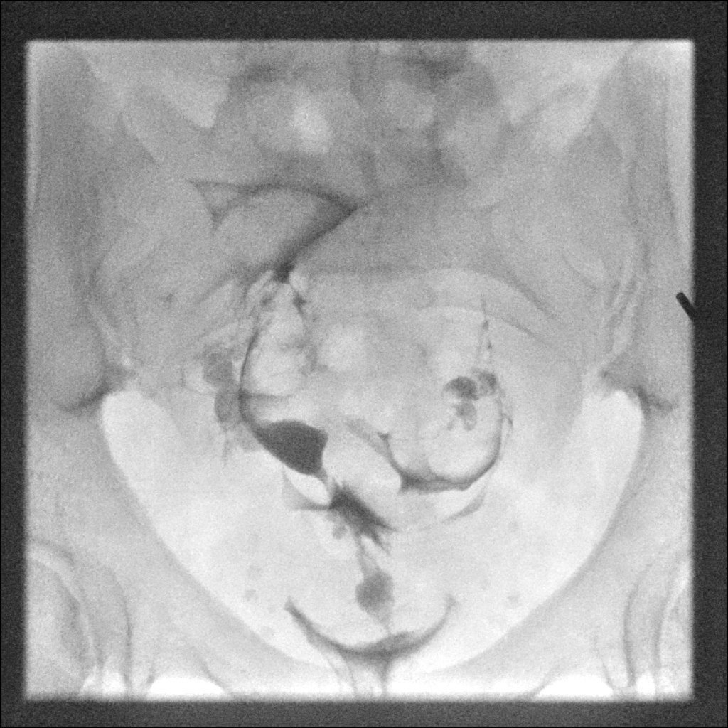

[9 of 9 positions shown; findings below may reference images not displayed]

FLUOROSCOPY:
Radiation Exposure Index (as provided by the fluoroscopic device):
26.1 mGy Kerma
FINDINGS: Endometrial Cavity: Normal appearance. No signs of Mullerian duct
anomaly or other significant abnormality.

Right Fallopian Tube: Normal appearance. Free intraperitoneal spill
of contrast is demonstrated.

Left Fallopian Tube: Normal appearance. Free intraperitoneal spill
of contrast is demonstrated.

Other:  None.
IMPRESSION: Normal appearance of endometrial cavity.

Both Fallopian tubes are patent.

## 2023-12-02 ENCOUNTER — Telehealth: Payer: No Typology Code available for payment source | Admitting: Family Medicine

## 2023-12-02 DIAGNOSIS — J111 Influenza due to unidentified influenza virus with other respiratory manifestations: Secondary | ICD-10-CM | POA: Diagnosis not present

## 2023-12-02 MED ORDER — OSELTAMIVIR PHOSPHATE 75 MG PO CAPS: 75.0000 mg | ORAL_CAPSULE | Freq: Two times a day (BID) | ORAL | 0 refills | Status: AC

## 2023-12-02 MED ORDER — PROMETHAZINE-DM 6.25-15 MG/5ML PO SYRP: 5.0000 mL | ORAL_SOLUTION | Freq: Four times a day (QID) | ORAL | 0 refills | Status: AC | PRN

## 2023-12-02 NOTE — Progress Notes (Signed)
Virtual Visit Consent   Gina Mcdowell, you are scheduled for a virtual visit with a [facility] provider today. Just as with appointments in the office, your consent must be obtained to participate. Your consent will be active for this visit and any virtual visit you may have with one of our providers in the next 365 days. If you have a MyChart account, a copy of this consent can be sent to you electronically.  As this is a virtual visit, video technology does not allow for your provider to perform a traditional examination. This may limit your provider's ability to fully assess your condition. If your provider identifies any concerns that need to be evaluated in person or the need to arrange testing (such as labs, EKG, etc.), we will make arrangements to do so. Although advances in technology are sophisticated, we cannot ensure that it will always work on either your end or our end. If the connection with a video visit is poor, the visit may have to be switched to a telephone visit. With either a video or telephone visit, we are not always able to ensure that we have a secure connection.  By engaging in this virtual visit, you consent to the provision of healthcare and authorize for your insurance to be billed (if applicable) for the services provided during this visit. Depending on your insurance coverage, you may receive a charge related to this service.  I need to obtain your verbal consent now. Are you willing to proceed with your visit today? [name] has provided verbal consent on 12/02/2023 for a virtual visit (video or telephone). Georgana Curio, FNP  Date: 12/02/2023 6:13 PM  Virtual Visit via Video Note   I, Georgana Curio, connected with  Gina Mcdowell  ([id number], 1986/06/16) on 12/02/23 at  6:00 PM EST by a video-enabled telemedicine application and verified that I am speaking with the correct person using two identifiers.  Location: Patient: Virtual Visit Location Patient:  Home Provider: Virtual Visit Location Provider: Home Office   I discussed the limitations of evaluation and management by telemedicine and the availability of in person appointments. The patient expressed understanding and agreed to proceed.    History of Present Illness: Gina Mcdowell is a 38 y.o. who identifies as a female who was assigned female at birth, and is being seen today for cough, fever, chills, diarrhea, and body aches or 2-3 days. Persistent. Marland Kitchen  HPI: HPI  Problems:  Patient Active Problem List   Diagnosis Date Noted   Infertility counseling 01/15/2022   Anxiety and depression 12/16/2021   Hyperlipidemia 11/07/2021   Apneic episode 12/08/2019   Sleep disorder breathing 12/08/2019   Dysfunction of right eustachian tube 11/11/2018   Headache disorder 10/26/2018   Referred otalgia of right ear 10/26/2018   Atypical chest pain 10/05/2018   Essential hypertension 10/05/2018   GERD (gastroesophageal reflux disease) 07/28/2018   Proteinuria 04/07/2014    Allergies: No Known Allergies Medications:  Current Outpatient Medications:    oseltamivir (TAMIFLU) 75 MG capsule, Take 1 capsule (75 mg total) by mouth 2 (two) times daily for 5 days., Disp: 10 capsule, Rfl: 0   promethazine-dextromethorphan (PROMETHAZINE-DM) 6.25-15 MG/5ML syrup, Take 5 mLs by mouth 4 (four) times daily as needed for up to 10 days for cough., Disp: 118 mL, Rfl: 0   atorvastatin (LIPITOR) 20 MG tablet, Take 1 tablet by mouth once daily, Disp: 90 tablet, Rfl: 0   atorvastatin (LIPITOR) 20 MG tablet, TAKE 1  TABLET BY MOUTH DAILY, Disp: 90 tablet, Rfl: 1   FLUoxetine (PROZAC) 10 MG capsule, Take 1 capsule (10 mg total) by mouth daily., Disp: 30 capsule, Rfl: 2   labetalol (NORMODYNE) 100 MG tablet, Take 1 tablet by mouth twice daily, Disp: 60 tablet, Rfl: 0   ondansetron (ZOFRAN-ODT) 4 MG disintegrating tablet, Take 1 tablet (4 mg total) by mouth every 8 (eight) hours as needed for nausea or vomiting., Disp:  30 tablet, Rfl: 1   pantoprazole (PROTONIX) 40 MG tablet, Take 1 tablet (40 mg total) by mouth daily., Disp: 90 tablet, Rfl: 0   sucralfate (CARAFATE) 1 GM/10ML suspension, Take 10 mLs (1 g total) by mouth 4 (four) times daily -  with meals and at bedtime for 10 days., Disp: 400 mL, Rfl: 0  Observations/Objective: Patient is well-developed, well-nourished in no acute distress.  Resting comfortably  at home.  Head is normocephalic, atraumatic.  No labored breathing.  Speech is clear and coherent with logical content.  Patient is alert and oriented at baseline.    Assessment and Plan: 1. Influenza (Primary)  Increase fluids, humidifier at night, tylenol or ibuprofen as directed, UC if sx worsen.   Follow Up Instructions: I discussed the assessment and treatment plan with the patient. The patient was provided an opportunity to ask questions and all were answered. The patient agreed with the plan and demonstrated an understanding of the instructions.  A copy of instructions were sent to the patient via MyChart unless otherwise noted below.     The patient was advised to call back or seek an in-person evaluation if the symptoms worsen or if the condition fails to improve as anticipated.    Georgana Curio, FNP

## 2023-12-02 NOTE — Patient Instructions (Signed)

## 2023-12-28 ENCOUNTER — Telehealth: Payer: No Typology Code available for payment source | Admitting: Physician Assistant

## 2023-12-28 ENCOUNTER — Other Ambulatory Visit: Payer: Self-pay | Admitting: Family

## 2023-12-28 DIAGNOSIS — I10 Essential (primary) hypertension: Secondary | ICD-10-CM

## 2023-12-28 DIAGNOSIS — H6503 Acute serous otitis media, bilateral: Secondary | ICD-10-CM | POA: Diagnosis not present

## 2023-12-28 DIAGNOSIS — E785 Hyperlipidemia, unspecified: Secondary | ICD-10-CM

## 2023-12-28 MED ORDER — AMOXICILLIN-POT CLAVULANATE 875-125 MG PO TABS: 1.0000 | ORAL_TABLET | Freq: Two times a day (BID) | ORAL | 0 refills | Status: DC

## 2023-12-28 NOTE — Progress Notes (Signed)

## 2023-12-29 NOTE — Telephone Encounter (Signed)
 Schedule appointment. Last office visit 01/15/2023.

## 2023-12-30 NOTE — Telephone Encounter (Signed)
 Schedule appointment. Last office visit 01/15/2023.

## 2023-12-31 ENCOUNTER — Other Ambulatory Visit: Payer: Self-pay | Admitting: Family

## 2023-12-31 ENCOUNTER — Encounter: Payer: Self-pay | Admitting: *Deleted

## 2023-12-31 ENCOUNTER — Other Ambulatory Visit: Payer: Self-pay

## 2023-12-31 ENCOUNTER — Ambulatory Visit
Admission: EM | Admit: 2023-12-31 | Discharge: 2023-12-31 | Disposition: A | Discharge status: Home / Self Care | Payer: No Typology Code available for payment source | Attending: Family Medicine | Admitting: Family Medicine

## 2023-12-31 DIAGNOSIS — E785 Hyperlipidemia, unspecified: Secondary | ICD-10-CM

## 2023-12-31 DIAGNOSIS — Z20822 Contact with and (suspected) exposure to covid-19: Secondary | ICD-10-CM | POA: Diagnosis not present

## 2023-12-31 DIAGNOSIS — J988 Other specified respiratory disorders: Secondary | ICD-10-CM | POA: Diagnosis not present

## 2023-12-31 DIAGNOSIS — I10 Essential (primary) hypertension: Secondary | ICD-10-CM | POA: Diagnosis not present

## 2023-12-31 DIAGNOSIS — B9789 Other viral agents as the cause of diseases classified elsewhere: Secondary | ICD-10-CM | POA: Insufficient documentation

## 2023-12-31 DIAGNOSIS — H6121 Impacted cerumen, right ear: Secondary | ICD-10-CM | POA: Diagnosis present

## 2023-12-31 MED ORDER — LABETALOL HCL 100 MG PO TABS: 200.0000 mg | ORAL_TABLET | Freq: Two times a day (BID) | ORAL | 0 refills | Status: DC

## 2023-12-31 NOTE — Discharge Instructions (Addendum)
 Continue to monitor your blood pressure at home.  I have increased your labetalol from 100 mg twice a day to 200 mg twice a day.  If your pulse rate drops below 60 while taking the increased dose call your primary care office because of the low heart rate and they will direct you as to what adjustments to make with your medications.  Primary care appointment on 3/15. Continue Augmentin. Your COVID test result within 24 to 48 hours.  In the meantime if you have fever continue management of fever with Tylenol and increase fluid intake to prevent dehydration.

## 2023-12-31 NOTE — ED Triage Notes (Addendum)
 Pt reports headache and continued ear pain (bilateral) after video visit 3 days ago - states she has finished 3 days of Augmentin. Also reports her BP is up and low grade fever (99.1). States she had the flu 2 weeks ago. Taking ibuprofen for pain. States she wanted to be seen in a face to face visit.

## 2023-12-31 NOTE — ED Provider Notes (Signed)
 [name]    CSN: [id number] Arrival date & time: 12/31/23  1743      History   Chief Complaint Chief Complaint  Patient presents with   Otalgia   Hypertension    HPI Gina Mcdowell is a 38 y.o. female.   Here today with bilateral ear pain.  Patient had a tele visit and was diagnosed with bilateral ear infections and prescribed Augmentin.  It was 3 days ago and she has completed 3 days of Augmentin.  Patient did have the flu 2 weeks ago.  Patient suffers from hypertension and her blood pressure is elevated at 164/116 here in clinic.  She has checked her blood pressure earlier today at home and it was 140's/ 100s she is established with primary care [facility], is currently prescribed labetalol 100 mg BID.  Reports she had ran out of her blood pressure medication however resume taking the blood pressure medication on Sunday and her readings have been high ever since.  She has been on labetalol to 100 mg twice daily for a while and is concerned that medication is not working.  Patient has had a headache but reports she is also been dealing with upper respiratory symptoms over the last few days. Past Medical History:  Diagnosis Date   Acid reflux    Anxiety    Hypertension     Patient Active Problem List   Diagnosis Date Noted   Infertility counseling 01/15/2022   Anxiety and depression 12/16/2021   Hyperlipidemia 11/07/2021   Apneic episode 12/08/2019   Sleep disorder breathing 12/08/2019   Dysfunction of right eustachian tube 11/11/2018   Headache disorder 10/26/2018   Referred otalgia of right ear 10/26/2018   Atypical chest pain 10/05/2018   Essential hypertension 10/05/2018   GERD (gastroesophageal reflux disease) 07/28/2018   Proteinuria 04/07/2014    History reviewed. No pertinent surgical history.  OB History     Gravida  0   Para  0   Term  0   Preterm  0   AB  0   Living  0      SAB  0   IAB  0   Ectopic  0   Multiple  0    Live Births  0            Home Medications    Prior to Admission medications   Medication Sig Start Date End Date Taking? Authorizing Provider  amoxicillin-clavulanate (AUGMENTIN) 875-125 MG tablet Take 1 tablet by mouth 2 (two) times daily. 12/28/23  Yes Joycelyn Man M, PA-C  atorvastatin (LIPITOR) 20 MG tablet Take 1 tablet by mouth once daily 06/22/23  Yes Zonia Kief, Amy J, NP  FLUoxetine (PROZAC) 10 MG capsule Take 1 capsule (10 mg total) by mouth daily. 01/15/23  Yes Zonia Kief, Amy J, NP  pantoprazole (PROTONIX) 40 MG tablet Take 1 tablet (40 mg total) by mouth daily. 03/19/23  Yes Zonia Kief, Amy J, NP  sucralfate (CARAFATE) 1 GM/10ML suspension Take 10 mLs (1 g total) by mouth 4 (four) times daily -  with meals and at bedtime for 10 days. 09/18/23 12/31/23 Yes Burnette, Alessandra Bevels, PA-C  labetalol (NORMODYNE) 100 MG tablet Take 2 tablets (200 mg total) by mouth 2 (two) times daily. Take 1 tablet by mouth twice daily 12/31/23   Bing Neighbors, NP    Family History Family History  Problem Relation Age of Onset   Hypertension Mother    Hyperlipidemia Father    Hypertension Maternal Grandmother  Diabetes Paternal Grandmother    Cancer Paternal Grandmother        skin cancer melanoma   Cancer Paternal Grandfather        prostate   Cancer Other        colon and breast    Social History Social History   Tobacco Use   Smoking status: Never    Passive exposure: Never   Smokeless tobacco: Never  Vaping Use   Vaping status: Never Used  Substance Use Topics   Alcohol use: Not Currently    Alcohol/week: 2.0 - 3.0 standard drinks of alcohol    Types: 2 - 3 Glasses of wine per week   Drug use: No     Allergies   Patient has no known allergies.  Review of Systems Review of Systems  Constitutional:  Positive for fever.  HENT:  Positive for ear pain and sore throat.      Physical Exam Triage Vital Signs ED Triage Vitals  Encounter Vitals Group     BP 12/31/23  1751 (!) 164/116     Systolic BP Percentile --      Diastolic BP Percentile --      Pulse Rate 12/31/23 1751 83     Resp 12/31/23 1751 20     Temp 12/31/23 1751 98.1 F (36.7 C)     Temp Source 12/31/23 1751 Oral     SpO2 12/31/23 1751 98 %     Weight --      Height --      Head Circumference --      Peak Flow --      Pain Score 12/31/23 1757 7     Pain Loc --      Pain Education --      Exclude from Growth Chart --    No data found.  Updated Vital Signs BP (!) 164/116 (BP Location: Left Arm)   Pulse 83   Temp 98.1 F (36.7 C) (Oral)   Resp 20   LMP 12/24/2023 (Approximate)   SpO2 98%   Visual Acuity Right Eye Distance:   Left Eye Distance:   Bilateral Distance:    Right Eye Near:   Left Eye Near:    Bilateral Near:     Physical Exam Constitutional:      Appearance: Normal appearance.  HENT:     Head: Normocephalic and atraumatic.     Right Ear: External ear normal.     Left Ear: Tympanic membrane and ear canal normal.     Nose: Congestion present.  Eyes:     Extraocular Movements: Extraocular movements intact.     Conjunctiva/sclera: Conjunctivae normal.     Pupils: Pupils are equal, round, and reactive to light.  Cardiovascular:     Rate and Rhythm: Normal rate and regular rhythm.  Pulmonary:     Effort: Pulmonary effort is normal.     Breath sounds: Normal breath sounds.  Neurological:     Mental Status: She is alert.      UC Treatments / Results  Labs (all labs ordered are listed, but only abnormal results are displayed) Labs Reviewed  SARS CORONAVIRUS 2 (TAT 6-24 HRS)    EKG   Radiology No results found.  Procedures Procedures (including critical care time)  Medications Ordered in UC Medications - No data to display  Initial Impression / Assessment and Plan / UC Course  I have reviewed the triage vital signs and the nursing notes.  Pertinent labs & imaging results  that were available during my care of the patient were reviewed by  me and considered in my medical decision making (see chart for details).    Treating for a viral respiratory illness however we will send out a PCR COVID test to rule out COVID as she reports her husband has recently been sick and her symptoms started shortly after.  Recently had influenza with complete resolution of her symptoms until 3 days ago.  Patient had fever today therefore pertinent to rule out COVID.  COVID is positive patient should continue with over-the-counter treatment.  Cerumen impaction involving right ear, ear lavage performed by nursing with complete removal of wax.  If there appears to be resolving advised to continue Augmentin.  Hypertension, Accelerated increase Labetalol to 200 twice daily, scheduled to see her PCP within 2 and half weeks advised to keep a log of blood pressure readings and pulse rates.  If heart rate drops below 60 call PCP office and asked to speak with the nurse for advice on medication management.  Follow-up with PCP as needed. Final Clinical Impressions(s) / UC Diagnoses   Final diagnoses:  Viral respiratory illness  Accelerated hypertension  Impacted cerumen of right ear  Encounter for laboratory testing for COVID-19 virus     Discharge Instructions      Continue to monitor your blood pressure at home.  I have increased your labetalol from 100 mg twice a day to 200 mg twice a day.  If your pulse rate drops below 60 while taking the increased dose call your primary care office because of the low heart rate and they will direct you as to what adjustments to make with your medications.  Primary care appointment on 3/15. Continue Augmentin. Your COVID test result within 24 to 48 hours.  In the meantime if you have fever continue management of fever with Tylenol and increase fluid intake to prevent dehydration.     ED Prescriptions     Medication Sig Dispense Auth. Provider   labetalol (NORMODYNE) 100 MG tablet Take 2 tablets (200 mg total) by  mouth 2 (two) times daily. Take 1 tablet by mouth twice daily 60 tablet Bing Neighbors, NP      PDMP not reviewed this encounter.   Bing Neighbors, NP 12/31/23 [phone]

## 2024-01-01 LAB — SARS CORONAVIRUS 2 (TAT 6-24 HRS): SARS Coronavirus 2: NEGATIVE

## 2024-01-01 NOTE — Telephone Encounter (Signed)
 Schedule appointment. Last office visit 01/15/2023.

## 2024-01-15 ENCOUNTER — Ambulatory Visit (INDEPENDENT_AMBULATORY_CARE_PROVIDER_SITE_OTHER): Payer: Self-pay | Admitting: Family

## 2024-01-15 ENCOUNTER — Telehealth: Payer: Self-pay

## 2024-01-15 VITALS — BP 127/87 | HR 85 | Temp 98.7°F | Ht 63.0 in | Wt 183.4 lb

## 2024-01-15 DIAGNOSIS — Z1329 Encounter for screening for other suspected endocrine disorder: Secondary | ICD-10-CM

## 2024-01-15 DIAGNOSIS — K219 Gastro-esophageal reflux disease without esophagitis: Secondary | ICD-10-CM

## 2024-01-15 DIAGNOSIS — Z131 Encounter for screening for diabetes mellitus: Secondary | ICD-10-CM | POA: Diagnosis not present

## 2024-01-15 DIAGNOSIS — Z13228 Encounter for screening for other metabolic disorders: Secondary | ICD-10-CM | POA: Diagnosis not present

## 2024-01-15 DIAGNOSIS — Z13220 Encounter for screening for lipoid disorders: Secondary | ICD-10-CM

## 2024-01-15 DIAGNOSIS — Z0000 Encounter for general adult medical examination without abnormal findings: Secondary | ICD-10-CM

## 2024-01-15 DIAGNOSIS — I10 Essential (primary) hypertension: Secondary | ICD-10-CM

## 2024-01-15 DIAGNOSIS — Z130 Encounter for screening for diseases of the blood and blood-forming organs and certain disorders involving the immune mechanism: Secondary | ICD-10-CM | POA: Diagnosis not present

## 2024-01-15 DIAGNOSIS — H9202 Otalgia, left ear: Secondary | ICD-10-CM

## 2024-01-15 MED ORDER — SUCRALFATE 1 GM/10ML PO SUSP: 1.0000 g | Freq: Three times a day (TID) | ORAL | 2 refills | Status: AC

## 2024-01-15 MED ORDER — LABETALOL HCL 100 MG PO TABS: 200.0000 mg | ORAL_TABLET | Freq: Two times a day (BID) | ORAL | 2 refills | Status: DC

## 2024-01-15 NOTE — Telephone Encounter (Signed)
 Copied from CRM [id number]. Topic: Clinical - Prescription Issue >> Jan 15, 2024 12:36 PM Antwanette L wrote: Reason for CRM: Please send a  new order for labetalol (NORMODYNE) 100 MG tablet to [facility] at [facility] Details [facility] [address] [place], [place], [place] [address] Phone: [phone]

## 2024-01-15 NOTE — Progress Notes (Signed)
 Patient ID: Gina Mcdowell, female    DOB: Feb 01, 1986  MRN: [id number]  CC: Annual Exam  Subjective: Gina Mcdowell is a 38 y.o. female who presents for annual exam.   Her concerns today include:  - Doing well on Labetalol, no issues/concerns. She does not complain of red flag symptoms such as but not limited to chest pain, shortness of breath, worst headache of life, nausea/vomiting.  - Doing well on Sucralfate, no issues/concerns. - Left ear discomfort. Denies red flag symptoms. Requests referral to ENT.   Patient Active Problem List   Diagnosis Date Noted   Infertility counseling 01/15/2022   Anxiety and depression 12/16/2021   Hyperlipidemia 11/07/2021   Apneic episode 12/08/2019   Sleep disorder breathing 12/08/2019   Dysfunction of right eustachian tube 11/11/2018   Headache disorder 10/26/2018   Referred otalgia of right ear 10/26/2018   Atypical chest pain 10/05/2018   Essential hypertension 10/05/2018   GERD (gastroesophageal reflux disease) 07/28/2018   Proteinuria 04/07/2014     Current Outpatient Medications on File Prior to Visit  Medication Sig Dispense Refill   atorvastatin (LIPITOR) 20 MG tablet Take 1 tablet by mouth once daily 90 tablet 0   FLUoxetine (PROZAC) 10 MG capsule Take 1 capsule (10 mg total) by mouth daily. 30 capsule 2   pantoprazole (PROTONIX) 40 MG tablet Take 1 tablet (40 mg total) by mouth daily. 90 tablet 0   amoxicillin-clavulanate (AUGMENTIN) 875-125 MG tablet Take 1 tablet by mouth 2 (two) times daily. 20 tablet 0   No current facility-administered medications on file prior to visit.    No Known Allergies  Social History   Socioeconomic History   Marital status: Married    Spouse name: Not on file   Number of children: Not on file   Years of education: Not on file   Highest education level: Not on file  Occupational History   Not on file  Tobacco Use   Smoking status: Never    Passive exposure: Never   Smokeless tobacco:  Never  Vaping Use   Vaping status: Never Used  Substance and Sexual Activity   Alcohol use: Not Currently    Alcohol/week: 2.0 - 3.0 standard drinks of alcohol    Types: 2 - 3 Glasses of wine per week   Drug use: No   Sexual activity: Yes    Birth control/protection: None  Other Topics Concern   Not on file  Social History Narrative   ** Merged History Encounter **       Works as Recruitment consultant at [employer], part time [employer]   Single   No children   No pets   Likes sleeping,speding time with family   She has 2 brothers and 2 sisters- all local.   Associates in medical billing/coding   Social Drivers of Health   Financial Resource Strain: Low Risk  (01/15/2024)   Overall Financial Resource Strain (CARDIA)    Difficulty of Paying Living Expenses: Not hard at all  Food Insecurity: Not on file  Transportation Needs: Not on file  Physical Activity: Insufficiently Active (01/15/2024)   Exercise Vital Sign    Days of Exercise per Week: 1 day    Minutes of Exercise per Session: 60 min  Stress: Stress Concern Present (01/15/2024)   [employer] of Occupational Health - Occupational Stress Questionnaire    Feeling of Stress : To some extent  Social Connections: Moderately Isolated (01/15/2024)   Social Connection and Isolation Panel [  NHANES]    Frequency of Communication with Friends and Family: More than three times a week    Frequency of Social Gatherings with Friends and Family: Once a week    Attends Religious Services: Never    Database administrator or Organizations: No    Attends Banker Meetings: Never    Marital Status: Married  Catering manager Violence: Not At Risk (01/15/2024)   Humiliation, Afraid, Rape, and Kick questionnaire    Fear of Current or Ex-Partner: No    Emotionally Abused: No    Physically Abused: No    Sexually Abused: No    Family History  Problem Relation Age of Onset   Hypertension Mother    Hyperlipidemia Father     Hypertension Maternal Grandmother    Diabetes Paternal Grandmother    Cancer Paternal Grandmother        skin cancer melanoma   Cancer Paternal Grandfather        prostate   Cancer Other        colon and breast    No past surgical history on file.  ROS: Review of Systems Negative except as stated above  PHYSICAL EXAM: BP 127/87   Pulse 85   Temp 98.7 F (37.1 C) (Oral)   Ht 5\' 3"  (1.6 m)   Wt 183 lb 6.4 oz (83.2 kg)   LMP 12/24/2023 (Approximate)   SpO2 98%   BMI 32.49 kg/m   Physical Exam HENT:     Head: Normocephalic and atraumatic.     Right Ear: Tympanic membrane, ear canal and external ear normal.     Left Ear: Tympanic membrane, ear canal and external ear normal.     Nose: Nose normal.     Mouth/Throat:     Mouth: Mucous membranes are moist.     Pharynx: Oropharynx is clear.  Eyes:     Extraocular Movements: Extraocular movements intact.     Conjunctiva/sclera: Conjunctivae normal.     Pupils: Pupils are equal, round, and reactive to light.  Neck:     Thyroid: No thyroid mass, thyromegaly or thyroid tenderness.  Cardiovascular:     Rate and Rhythm: Normal rate and regular rhythm.     Pulses: Normal pulses.     Heart sounds: Normal heart sounds.  Pulmonary:     Effort: Pulmonary effort is normal.     Breath sounds: Normal breath sounds.  Chest:     Comments: Patient declined. Abdominal:     General: Bowel sounds are normal.     Palpations: Abdomen is soft.  Genitourinary:    Comments: Patient declined.  Musculoskeletal:        General: Normal range of motion.     Right shoulder: Normal.     Left shoulder: Normal.     Right upper arm: Normal.     Left upper arm: Normal.     Right elbow: Normal.     Left elbow: Normal.     Right forearm: Normal.     Left forearm: Normal.     Right wrist: Normal.     Left wrist: Normal.     Right hand: Normal.     Left hand: Normal.     Cervical back: Normal, normal range of motion and neck supple.      Thoracic back: Normal.     Lumbar back: Normal.     Right hip: Normal.     Left hip: Normal.     Right upper leg: Normal.  Left upper leg: Normal.     Right knee: Normal.     Left knee: Normal.     Right lower leg: Normal.     Left lower leg: Normal.     Right ankle: Normal.     Left ankle: Normal.     Right foot: Normal.     Left foot: Normal.  Skin:    General: Skin is warm and dry.     Capillary Refill: Capillary refill takes less than 2 seconds.  Neurological:     General: No focal deficit present.     Mental Status: She is alert and oriented to person, place, and time.  Psychiatric:        Mood and Affect: Mood normal.        Behavior: Behavior normal.     ASSESSMENT AND PLAN: 1. Annual physical exam (Primary) - Counseled on 150 minutes of exercise per week as tolerated, healthy eating (including decreased daily intake of saturated fats, cholesterol, added sugars, sodium), STI prevention, and routine healthcare maintenance.  2. Screening for metabolic disorder - Routine screening.  - CMP14+EGFR  3. Screening for deficiency anemia - Routine screening.  - CBC  4. Diabetes mellitus screening - Routine screening.  - Hemoglobin A1c  5. Screening cholesterol level - Routine screening.  - Lipid panel  6. Thyroid disorder screen - Routine screening.  - TSH  7. Primary hypertension - Continue Labetalol as prescribed.  - Counseled on blood pressure goal of less than 130/80, low-sodium, DASH diet, medication compliance, and 150 minutes of moderate intensity exercise per week as tolerated. Counseled on medication adherence and adverse effects. - Follow-up with primary provider in 3 months or sooner if needed.  - labetalol (NORMODYNE) 100 MG tablet; Take 2 tablets (200 mg total) by mouth 2 (two) times daily. Take 1 tablet by mouth twice daily  Dispense: 120 tablet; Refill: 2  8. Gastroesophageal reflux disease without esophagitis - Continue Sucralfate as  prescribed. Counseled on medication adherence/adverse effects.  - Follow-up with primary provider as scheduled. - sucralfate (CARAFATE) 1 GM/10ML suspension; Take 10 mLs (1 g total) by mouth 4 (four) times daily -  with meals and at bedtime.  Dispense: 400 mL; Refill: 2  9. Discomfort of left ear - Referral to ENT for evaluation/management. - Ambulatory referral to ENT    Patient was given the opportunity to ask questions.  Patient verbalized understanding of the plan and was able to repeat key elements of the plan. Patient was given clear instructions to go to Emergency Department or return to medical center if symptoms don't improve, worsen, or new problems develop.The patient verbalized understanding.   Orders Placed This Encounter  Procedures   CBC   Lipid panel   CMP14+EGFR   Hemoglobin A1c   TSH   Ambulatory referral to ENT     Requested Prescriptions   Signed Prescriptions Disp Refills   sucralfate (CARAFATE) 1 GM/10ML suspension 400 mL 2    Sig: Take 10 mLs (1 g total) by mouth 4 (four) times daily -  with meals and at bedtime.   labetalol (NORMODYNE) 100 MG tablet 120 tablet 2    Sig: Take 2 tablets (200 mg total) by mouth 2 (two) times daily. Take 1 tablet by mouth twice daily    Return in about 1 year (around 01/14/2025) for Physical per patient preference and 3 months chronic conditions.  Rema Fendt, NP

## 2024-01-15 NOTE — Progress Notes (Signed)
 Patient asking for refrral for ENT.  Concerns with blood pressure.   Asking if sulcrafate can stay on her chart and we can prescirbe it. States its about once a year she needs it.

## 2024-01-15 NOTE — Telephone Encounter (Signed)
 Medication has been sent to pharmacy by patient provider

## 2024-01-16 LAB — CBC
Hematocrit: 42.6 % (ref 34.0–46.6)
Hemoglobin: 13.9 g/dL (ref 11.1–15.9)
MCH: 28.3 pg (ref 26.6–33.0)
MCHC: 32.6 g/dL (ref 31.5–35.7)
MCV: 87 fL (ref 79–97)
Platelets: 379 10*3/uL (ref 150–450)
RBC: 4.92 x10E6/uL (ref 3.77–5.28)
RDW: 13 % (ref 11.7–15.4)
WBC: 8.4 10*3/uL (ref 3.4–10.8)

## 2024-01-16 LAB — CMP14+EGFR
ALT: 11 IU/L (ref 0–32)
AST: 19 IU/L (ref 0–40)
Albumin: 4.1 g/dL (ref 3.9–4.9)
Alkaline Phosphatase: 70 IU/L (ref 44–121)
BUN/Creatinine Ratio: 9 (ref 9–23)
BUN: 9 mg/dL (ref 6–20)
Bilirubin Total: 0.6 mg/dL (ref 0.0–1.2)
CO2: 24 mmol/L (ref 20–29)
Calcium: 9.4 mg/dL (ref 8.7–10.2)
Chloride: 98 mmol/L (ref 96–106)
Creatinine, Ser: 1.03 mg/dL — ABNORMAL HIGH (ref 0.57–1.00)
Globulin, Total: 2.8 g/dL (ref 1.5–4.5)
Glucose: 74 mg/dL (ref 70–99)
Potassium: 4.2 mmol/L (ref 3.5–5.2)
Sodium: 137 mmol/L (ref 134–144)
Total Protein: 6.9 g/dL (ref 6.0–8.5)
eGFR: 72 mL/min/{1.73_m2} (ref >59)

## 2024-01-16 LAB — LIPID PANEL
Chol/HDL Ratio: 3.3 ratio (ref 0.0–4.4)
Cholesterol, Total: 132 mg/dL (ref 100–199)
HDL: 40 mg/dL (ref >39)
LDL Chol Calc (NIH): 70 mg/dL (ref 0–99)
Triglycerides: 125 mg/dL (ref 0–149)
VLDL Cholesterol Cal: 22 mg/dL (ref 5–40)

## 2024-01-16 LAB — HEMOGLOBIN A1C
Est. average glucose Bld gHb Est-mCnc: 103 mg/dL
Hgb A1c MFr Bld: 5.2 % (ref 4.8–5.6)

## 2024-01-16 LAB — TSH: TSH: 2.25 u[IU]/mL (ref 0.450–4.500)

## 2024-01-18 ENCOUNTER — Encounter: Payer: Self-pay | Admitting: Family

## 2024-01-18 ENCOUNTER — Telehealth: Payer: Self-pay | Admitting: Family

## 2024-01-18 NOTE — Telephone Encounter (Signed)
 Called patient to see if she picked the medication that was sent on the 14th.

## 2024-01-18 NOTE — Telephone Encounter (Signed)
 Patient needs prescription for labetalol (NORMODYNE) 100 MG tablet sent to [facility] and sucralfate (CARAFATE) 1 GM/10ML suspension  also other pharmacy is to far away from patient to go pick up refills

## 2024-01-19 ENCOUNTER — Other Ambulatory Visit (HOSPITAL_COMMUNITY): Payer: Self-pay

## 2024-01-20 ENCOUNTER — Other Ambulatory Visit: Payer: Self-pay

## 2024-01-20 ENCOUNTER — Other Ambulatory Visit (HOSPITAL_COMMUNITY): Payer: Self-pay

## 2024-01-20 ENCOUNTER — Encounter: Payer: Self-pay | Admitting: Family

## 2024-01-20 DIAGNOSIS — I10 Essential (primary) hypertension: Secondary | ICD-10-CM

## 2024-01-20 MED ORDER — LABETALOL HCL 100 MG PO TABS
200.0000 mg | ORAL_TABLET | Freq: Two times a day (BID) | ORAL | 2 refills | Status: DC
  Filled 2024-01-20 – 2024-01-22 (×2): qty 120, 30d supply, fill #0
  Filled 2024-02-27: qty 120, 30d supply, fill #1
  Filled 2024-09-05: qty 120, 30d supply, fill #2

## 2024-01-20 NOTE — Telephone Encounter (Signed)
 I resent these in to [facility].

## 2024-01-21 ENCOUNTER — Other Ambulatory Visit (HOSPITAL_COMMUNITY): Payer: Self-pay

## 2024-01-22 ENCOUNTER — Telehealth: Payer: Self-pay

## 2024-01-22 ENCOUNTER — Other Ambulatory Visit (HOSPITAL_COMMUNITY): Payer: Self-pay

## 2024-01-22 NOTE — Telephone Encounter (Signed)
 Copied from CRM [id number]. Topic: Clinical - Prescription Issue >> Jan 22, 2024  1:20 PM Dondra Prader E wrote: Reason for CRM: Pt called reporting that she needs her prescription corrected. The pharmacy has two conflicting instructions for her labetalol (NORMODYNE) 100 MG tablet. She is asking for someone to call and correct this.   [place] [facility] [address], [place] [address] Phone: [phone]  Fax: [phone] DEA #: [id number]

## 2024-01-23 ENCOUNTER — Other Ambulatory Visit (HOSPITAL_COMMUNITY): Payer: Self-pay

## 2024-01-25 NOTE — Telephone Encounter (Signed)
 Continue Labetalol 100 MG tablet; Take 2 tablets (200 mg total) by mouth 2 (two) times daily. Revised on 01/22/2024 from Susa Griffins, CMA.

## 2024-02-25 ENCOUNTER — Encounter: Payer: No Typology Code available for payment source | Admitting: Family

## 2024-05-03 ENCOUNTER — Ambulatory Visit: Admitting: Family

## 2024-06-21 ENCOUNTER — Ambulatory Visit: Admitting: Family

## 2024-09-26 ENCOUNTER — Emergency Department (HOSPITAL_COMMUNITY)

## 2024-09-26 ENCOUNTER — Ambulatory Visit (HOSPITAL_COMMUNITY)
Admission: EM | Admit: 2024-09-26 | Discharge: 2024-09-26 | Disposition: A | Discharge status: Home / Self Care | Attending: Family Medicine | Admitting: Family Medicine

## 2024-09-26 ENCOUNTER — Encounter (HOSPITAL_COMMUNITY): Payer: Self-pay | Admitting: *Deleted

## 2024-09-26 ENCOUNTER — Emergency Department (HOSPITAL_COMMUNITY)
Admission: EM | Admit: 2024-09-26 | Discharge: 2024-09-26 | Disposition: A | Discharge status: Home / Self Care | Attending: Emergency Medicine | Admitting: Emergency Medicine

## 2024-09-26 ENCOUNTER — Other Ambulatory Visit: Payer: Self-pay

## 2024-09-26 ENCOUNTER — Encounter (HOSPITAL_COMMUNITY): Payer: Self-pay

## 2024-09-26 DIAGNOSIS — R0789 Other chest pain: Secondary | ICD-10-CM | POA: Insufficient documentation

## 2024-09-26 DIAGNOSIS — R110 Nausea: Secondary | ICD-10-CM | POA: Insufficient documentation

## 2024-09-26 LAB — URINALYSIS, ROUTINE W REFLEX MICROSCOPIC
Bilirubin Urine: NEGATIVE
Glucose, UA: NEGATIVE mg/dL
Ketones, ur: NEGATIVE mg/dL
Nitrite: NEGATIVE
Protein, ur: NEGATIVE mg/dL
Specific Gravity, Urine: 1.012 (ref 1.005–1.030)
pH: 6 (ref 5.0–8.0)

## 2024-09-26 LAB — BASIC METABOLIC PANEL WITH GFR
Anion gap: 10 (ref 5–15)
BUN: 7 mg/dL (ref 6–20)
CO2: 23 mmol/L (ref 22–32)
Calcium: 8.3 mg/dL — ABNORMAL LOW (ref 8.9–10.3)
Chloride: 103 mmol/L (ref 98–111)
Creatinine, Ser: 0.93 mg/dL (ref 0.44–1.00)
GFR, Estimated: >60 mL/min (ref >60)
Glucose, Bld: 107 mg/dL — ABNORMAL HIGH (ref 70–99)
Potassium: 3.8 mmol/L (ref 3.5–5.1)
Sodium: 136 mmol/L (ref 135–145)

## 2024-09-26 LAB — CBC
HCT: 41 % (ref 36.0–46.0)
Hemoglobin: 13.5 g/dL (ref 12.0–15.0)
MCH: 28.3 pg (ref 26.0–34.0)
MCHC: 32.9 g/dL (ref 30.0–36.0)
MCV: 86 fL (ref 80.0–100.0)
Platelets: 377 K/uL (ref 150–400)
RBC: 4.77 MIL/uL (ref 3.87–5.11)
RDW: 12.7 % (ref 11.5–15.5)
WBC: 6.6 K/uL (ref 4.0–10.5)
nRBC: 0 % (ref 0.0–0.2)

## 2024-09-26 LAB — TROPONIN I (HIGH SENSITIVITY)
Troponin I (High Sensitivity): <2 ng/L (ref <18)
Troponin I (High Sensitivity): <2 ng/L (ref <18)

## 2024-09-26 LAB — HCG, SERUM, QUALITATIVE: Preg, Serum: NEGATIVE

## 2024-09-26 MED ORDER — FAMOTIDINE 20 MG PO TABS
20.0000 mg | ORAL_TABLET | Freq: Once | ORAL | Status: AC
  Administered 2024-09-26: 20 mg via ORAL
  Filled 2024-09-26: qty 1

## 2024-09-26 MED ORDER — SODIUM CHLORIDE 0.9 % IV BOLUS
1000.0000 mL | Freq: Once | INTRAVENOUS | Status: AC
  Administered 2024-09-26: 1000 mL via INTRAVENOUS

## 2024-09-26 NOTE — ED Notes (Signed)
 Patient is being discharged from the Urgent Care and sent to the Emergency Department via private vehicle . Per Dr. Vonna, patient is in need of higher level of care due to Chest pain. Patient is aware and verbalizes understanding of plan of care.  Vitals:   09/26/24 0953  BP: (!) 136/101  Pulse: 79  Resp: 16  Temp: 99 F (37.2 C)  SpO2: 100%

## 2024-09-26 NOTE — ED Notes (Signed)
..  Patient is A&Ox4 upon discharge. Patient verbalized understanding of discharge instructions and follow-up care. Patient ambulatory from ED with steady gait.

## 2024-09-26 NOTE — Discharge Instructions (Signed)
 She will go to the ER for further evaluation/tx

## 2024-09-26 NOTE — Discharge Instructions (Addendum)
 Imaging and lab work are reassuring today.  I have given you a referral to cardiology for further evaluation of chest pain.  If you experience worsening symptoms such as worsening chest pain, shortness of breath, nausea vomiting or any other concerning symptoms please return to ED for further evaluation.

## 2024-09-26 NOTE — ED Triage Notes (Signed)
 Pt c/o chest pain that started last night. Pt states she has had heartburn for past 2 days. Pt has had nausea, denies vomiting or shortness of breath. Pt went to UC and was referred here.

## 2024-09-26 NOTE — ED Provider Notes (Signed)
 [facility]    CSN: [id number] Arrival date & time: 09/26/24  0940      History   Chief Complaint Chief Complaint  Patient presents with   Chest Pain    HPI Gina Mcdowell is a 38 y.o. female.    Chest Pain Here for chest pain.  It began yesterday with some burning in her central anterior chest.  It then became sharp and stabbing in her left lower chest.  Then overnight she began having some pain radiate to her left shoulder blade.  No fever or cough  No shortness of breath.  The pain on her shoulder blade is maybe affected by movement and breathing deeply, but the anterior pain is not She has never smoked  She has a history of hypertension and high cholesterol and is on medication for both.  She does have a history of GERD and takes sucralfate .   Past Medical History:  Diagnosis Date   Acid reflux    Anxiety    Hypertension     Patient Active Problem List   Diagnosis Date Noted   Infertility counseling 01/15/2022   Anxiety and depression 12/16/2021   Hyperlipidemia 11/07/2021   Apneic episode 12/08/2019   Sleep disorder breathing 12/08/2019   Dysfunction of right eustachian tube 11/11/2018   Headache disorder 10/26/2018   Referred otalgia of right ear 10/26/2018   Atypical chest pain 10/05/2018   Essential hypertension 10/05/2018   GERD (gastroesophageal reflux disease) 07/28/2018   Proteinuria 04/07/2014    History reviewed. No pertinent surgical history.  OB History     Gravida  0   Para  0   Term  0   Preterm  0   AB  0   Living  0      SAB  0   IAB  0   Ectopic  0   Multiple  0   Live Births  0            Home Medications    Prior to Admission medications   Medication Sig Start Date End Date Taking? Authorizing Provider  atorvastatin  (LIPITOR) 20 MG tablet Take 1 tablet by mouth once daily 06/22/23   Jaycee Greig PARAS, NP  FLUoxetine  (PROZAC ) 10 MG capsule Take 1 capsule (10 mg total) by mouth daily. 01/15/23    Jaycee Greig PARAS, NP  labetalol  (NORMODYNE ) 100 MG tablet Take 2 tablets (200 mg total) by mouth 2 (two) times daily. 01/20/24 10/08/24  Jaycee Greig PARAS, NP  pantoprazole  (PROTONIX ) 40 MG tablet Take 1 tablet (40 mg total) by mouth daily. 03/19/23   Jaycee Greig PARAS, NP  sucralfate  (CARAFATE ) 1 GM/10ML suspension Take 10 mLs (1 g total) by mouth 4 (four) times daily -  with meals and at bedtime. 01/15/24   Jaycee Greig PARAS, NP    Family History Family History  Problem Relation Age of Onset   Hypertension Mother    Hyperlipidemia Father    Hypertension Maternal Grandmother    Diabetes Paternal Grandmother    Cancer Paternal Grandmother        skin cancer melanoma   Cancer Paternal Grandfather        prostate   Cancer Other        colon and breast    Social History Social History   Tobacco Use   Smoking status: Never    Passive exposure: Never   Smokeless tobacco: Never  Vaping Use   Vaping status: Never Used  Substance Use  Topics   Alcohol use: Not Currently    Alcohol/week: 2.0 - 3.0 standard drinks of alcohol    Types: 2 - 3 Glasses of wine per week   Drug use: No     Allergies   Patient has no known allergies.   Review of Systems Review of Systems  Cardiovascular:  Positive for chest pain.     Physical Exam Triage Vital Signs ED Triage Vitals  Encounter Vitals Group     BP 09/26/24 0953 (!) 136/101     Girls Systolic BP Percentile --      Girls Diastolic BP Percentile --      Boys Systolic BP Percentile --      Boys Diastolic BP Percentile --      Pulse Rate 09/26/24 0953 79     Resp 09/26/24 0953 16     Temp 09/26/24 0953 99 F (37.2 C)     Temp Source 09/26/24 0953 Oral     SpO2 09/26/24 0953 100 %     Weight --      Height --      Head Circumference --      Peak Flow --      Pain Score 09/26/24 0949 7     Pain Loc --      Pain Education --      Exclude from Growth Chart --    No data found.  Updated Vital Signs BP (!) 136/101 (BP Location: Right Arm)    Pulse 79   Temp 99 F (37.2 C) (Oral)   Resp 16   LMP 09/21/2024 (Approximate)   SpO2 100%   Visual Acuity Right Eye Distance:   Left Eye Distance:   Bilateral Distance:    Right Eye Near:   Left Eye Near:    Bilateral Near:     Physical Exam Vitals reviewed.  Constitutional:      General: She is not in acute distress.    Appearance: She is not toxic-appearing.  HENT:     Mouth/Throat:     Mouth: Mucous membranes are moist.     Pharynx: No oropharyngeal exudate or posterior oropharyngeal erythema.  Eyes:     Extraocular Movements: Extraocular movements intact.     Conjunctiva/sclera: Conjunctivae normal.     Pupils: Pupils are equal, round, and reactive to light.  Cardiovascular:     Rate and Rhythm: Normal rate and regular rhythm.     Heart sounds: No murmur heard. Pulmonary:     Effort: No respiratory distress.     Breath sounds: No stridor. No wheezing, rhonchi or rales.  Chest:     Chest wall: No tenderness.  Musculoskeletal:     Cervical back: Neck supple.  Lymphadenopathy:     Cervical: No cervical adenopathy.  Skin:    Capillary Refill: Capillary refill takes less than 2 seconds.     Coloration: Skin is not jaundiced or pale.  Neurological:     General: No focal deficit present.     Mental Status: She is alert and oriented to person, place, and time.  Psychiatric:        Behavior: Behavior normal.      UC Treatments / Results  Labs (all labs ordered are listed, but only abnormal results are displayed) Labs Reviewed - No data to display  EKG   Radiology No results found.  Procedures Procedures (including critical care time)  Medications Ordered in UC Medications - No data to display  Initial Impression / Assessment  and Plan / UC Course  I have reviewed the triage vital signs and the nursing notes.  Pertinent labs & imaging results that were available during my care of the patient were reviewed by me and considered in my medical  decision making (see chart for details).     EKG shows normal sinus rhythm and no worrisome ST segment changes other than she does have a prolonged QT.  Though this chest pain is atypical, I have asked her to proceed to the emergency room for further evaluation since she has a history of hypertension and hyperlipidemia, and this pain is not typical for GERD either.  She is agreeable and will go by private vehicle Final Clinical Impressions(s) / UC Diagnoses   Final diagnoses:  None   Discharge Instructions   None    ED Prescriptions   None    PDMP not reviewed this encounter.   Vonna Sharlet POUR, MD 09/26/24 [phone]

## 2024-09-26 NOTE — ED Provider Notes (Signed)
 [place] EMERGENCY DEPARTMENT AT [facility] Provider Note   CSN: [id number] Arrival date & time: 09/26/24  1019     Patient presents with: Chest Pain   Gina Mcdowell is a 38 y.o. female.  38 year old female presents with complaints of chest pain sent from urgent care.  She reports she was woken up in the middle of the night at approximately 3 AM from chest pain radiating to her left shoulder blade.  It lasted for approximately 2 minutes and then stopped she was able to go back to sleep but has been having this intermittent pain throughout the day.  She reports she has had GERD like symptoms for the past couple days and does have history of GERD which she takes Prilosec for.  She has had some nausea for the past couple days with no vomiting and denies any shortness of breath.  Patient reports she feels fine in the ED currently but urgent care sent her here to have blood work to rule out any acute cardiac injury.  Patient reports she feels like she has been dehydrated in the last couple days and not drinking water like she should but denies any dizziness or lightheadedness.     Prior to Admission medications   Medication Sig Start Date End Date Taking? Authorizing Provider  atorvastatin  (LIPITOR) 20 MG tablet Take 1 tablet by mouth once daily 06/22/23  Yes Massey, Amy J, NP  FLUoxetine  (PROZAC ) 10 MG capsule Take 1 capsule (10 mg total) by mouth daily. 01/15/23  Yes Massey, Amy J, NP  labetalol  (NORMODYNE ) 100 MG tablet Take 2 tablets (200 mg total) by mouth 2 (two) times daily. 01/20/24 10/08/24 Yes Massey, Amy J, NP  pantoprazole  (PROTONIX ) 40 MG tablet Take 1 tablet (40 mg total) by mouth daily. Patient taking differently: Take 40 mg by mouth daily as needed (reflux). 03/19/23  Yes Massey, Amy J, NP  sucralfate  (CARAFATE ) 1 GM/10ML suspension Take 10 mLs (1 g total) by mouth 4 (four) times daily -  with meals and at bedtime. Patient taking differently: Take 1 g by mouth in the  morning, at noon, and at bedtime. 01/15/24  Yes Jaycee Greig PARAS, NP    Allergies: Patient has no known allergies.    Review of Systems  Cardiovascular:  Positive for chest pain.  All other systems reviewed and are negative.   Updated Vital Signs BP (!) 147/91   Pulse 70   Temp 98.7 F (37.1 C)   Resp 17   Ht 5' 3 (1.6 m)   Wt 79.4 kg   LMP 09/21/2024 (Approximate)   SpO2 100%   BMI 31.00 kg/m   Physical Exam Vitals and nursing note reviewed.  Constitutional:      Appearance: Normal appearance.  HENT:     Head: Normocephalic and atraumatic.     Nose: Nose normal.  Eyes:     Extraocular Movements: Extraocular movements intact.     Conjunctiva/sclera: Conjunctivae normal.     Pupils: Pupils are equal, round, and reactive to light.  Cardiovascular:     Rate and Rhythm: Normal rate.     Heart sounds: Normal heart sounds.  Pulmonary:     Effort: Pulmonary effort is normal. No respiratory distress.     Breath sounds: Normal breath sounds.  Chest:     Comments: No reproducible chest pain on palpation of the chest wall.  Patient denies current chest pain in ED. Musculoskeletal:        General: Normal  range of motion.     Cervical back: Normal range of motion.     Comments: No lower extremity edema noted.  Neurological:     General: No focal deficit present.     Mental Status: She is alert.  Psychiatric:        Mood and Affect: Mood normal.        Behavior: Behavior normal.     (all labs ordered are listed, but only abnormal results are displayed) Labs Reviewed  BASIC METABOLIC PANEL WITH GFR - Abnormal; Notable for the following components:      Result Value   Glucose, Bld 107 (*)    Calcium  8.3 (*)    All other components within normal limits  URINALYSIS, ROUTINE W REFLEX MICROSCOPIC - Abnormal; Notable for the following components:   Hgb urine dipstick SMALL (*)    Leukocytes,Ua TRACE (*)    Bacteria, UA RARE (*)    All other components within normal limits   CBC  HCG, SERUM, QUALITATIVE  TROPONIN I (HIGH SENSITIVITY)  TROPONIN I (HIGH SENSITIVITY)    EKG: EKG Interpretation Date/Time:  Monday September 26 2024 10:02:03 EST Ventricular Rate:  79 PR Interval:  146 QRS Duration:  86 QT Interval:  422 QTC Calculation: 483 R Axis:   51  Text Interpretation: Normal sinus rhythm Prolonged QT Abnormal ECG Nonspecific T wave abnormality, improved in Anterior leads Confirmed by Santo Kelly ([id number]) on 09/26/2024 11:29:32 AM  Radiology: [facility] Chest 2 View Result Date: 09/26/2024 CLINICAL DATA:  Chest pain. EXAM: CHEST - 2 VIEW COMPARISON:  Chest radiograph dated 09/11/2022. FINDINGS: The heart size and mediastinal contours are within normal limits. Both lungs are clear. The visualized skeletal structures are unremarkable. IMPRESSION: No active cardiopulmonary disease. Electronically Signed   By: Vanetta Chou M.D.   On: 09/26/2024 11:31     Procedures   Medications Ordered in the ED  sodium chloride  0.9 % bolus 1,000 mL (0 mLs Intravenous Stopped 09/26/24 1349)  famotidine  (PEPCID ) tablet 20 mg (20 mg Oral Given 09/26/24 1252)    38 y.o. female presents to the ED with complaints of chest pain and nausea, The differential diagnosis includes but is not limited to ACS, GERD, pneumonia, pneumothorax, PE (Ddx)  On arrival pt is nontoxic, vitals significant for hypertension and no other abnormalities..  On exam patient has no current chest pain shortness of breath.  I ordered medication famotidine  for reported GERD symptoms.  Lab Tests:  I Ordered, reviewed, and interpreted labs, which included: BMP CBC troponin UA hCG qualitative  No abnormal results.  Imaging Studies ordered:  I ordered imaging studies which included chest x-ray with no acute abnormalities noted.  ED Course:   Patient sitting comfortably in ED bed no acute distress nontoxic-appearing.  Patient is PERC negative.  Patient's heart score is 2.  Patient currently  denies any chest pain.  Patient reports she feels like she has been dehydrated and IV bolus was started.  On reassessment patient denies any chest pain and is sitting comfortably in ED bed playing on phone.  She reports she is feeling better and denies any new concerns.  Imaging and lab work were reassuring.  Due to atypical presentation of chest pain patient was referred to cardiology for further evaluation and assessment.  Patient was given strict return precautions and agreed.  Patient was comfortable discharge at this time.  Portions of this note were generated with Scientist, clinical (histocompatibility and immunogenetics). Dictation errors may occur despite best attempts at proofreading.  Final diagnoses:  Atypical chest pain    ED Discharge Orders          Ordered    Ambulatory referral to Cardiology       Comments: If you have not heard from the Cardiology office within the next 72 hours please call [phone].   09/26/24 1523               Myriam Fonda RAMAN, PA-C 09/27/24 1154    Elnor Jayson LABOR, DO 09/30/24 [phone]

## 2024-09-26 NOTE — ED Triage Notes (Signed)
 Patient here today with c/o chest pain and left posterior shoulder pain since last night and heartburn X 2 days. Patient takes Sucralfate  and has a h/o acid reflux.

## 2024-11-10 ENCOUNTER — Encounter: Payer: Self-pay | Admitting: Emergency Medicine

## 2024-11-10 ENCOUNTER — Other Ambulatory Visit (HOSPITAL_COMMUNITY): Payer: Self-pay

## 2024-11-10 ENCOUNTER — Ambulatory Visit
Admission: EM | Admit: 2024-11-10 | Discharge: 2024-11-10 | Disposition: A | Discharge status: Home / Self Care | Attending: Emergency Medicine | Admitting: Emergency Medicine

## 2024-11-10 DIAGNOSIS — J0110 Acute frontal sinusitis, unspecified: Secondary | ICD-10-CM | POA: Diagnosis not present

## 2024-11-10 MED ORDER — AMOXICILLIN-POT CLAVULANATE 875-125 MG PO TABS
1.0000 | ORAL_TABLET | Freq: Two times a day (BID) | ORAL | 0 refills | Status: AC
  Filled 2024-11-10: qty 14, 7d supply, fill #0

## 2024-11-10 MED ORDER — PREDNISONE 10 MG (21) PO TBPK
ORAL_TABLET | Freq: Every day | ORAL | 0 refills | Status: AC
  Filled 2024-11-10: qty 21, 6d supply, fill #0

## 2024-11-10 NOTE — ED Triage Notes (Signed)
 Patient reports stuff nose, and  sinus pressure right side x 10 days. Patient has done the Netti pot  to clear sinus.

## 2024-11-10 NOTE — ED Provider Notes (Addendum)
 " [facility]    CSN: [id number] Arrival date & time: 11/10/24  1458      History   Chief Complaint Chief Complaint  Patient presents with   Facial Pain   Nasal Congestion    HPI Gina Mcdowell is a 39 y.o. female.   Patient presents for evaluation of nasal congestion, bilateral ear popping, sinus pain and pressure to the right side behind the eye causing blurred vision intermittently and headaches present for 10 days.  Has attempted use of a Nettie pot and ibuprofen.  Known sick contact prior.  Tolerable to food and liquids.  Denies fever, cough.  Past Medical History:  Diagnosis Date   Acid reflux    Anxiety    Hypertension     Patient Active Problem List   Diagnosis Date Noted   Infertility counseling 01/15/2022   Anxiety and depression 12/16/2021   Hyperlipidemia 11/07/2021   Apneic episode 12/08/2019   Sleep disorder breathing 12/08/2019   Dysfunction of right eustachian tube 11/11/2018   Headache disorder 10/26/2018   Referred otalgia of right ear 10/26/2018   Atypical chest pain 10/05/2018   Essential hypertension 10/05/2018   GERD (gastroesophageal reflux disease) 07/28/2018   Proteinuria 04/07/2014    History reviewed. No pertinent surgical history.  OB History     Gravida  0   Para  0   Term  0   Preterm  0   AB  0   Living  0      SAB  0   IAB  0   Ectopic  0   Multiple  0   Live Births  0            Home Medications    Prior to Admission medications  Medication Sig Start Date End Date Taking? Authorizing Provider  atorvastatin  (LIPITOR) 20 MG tablet Take 1 tablet by mouth once daily 06/22/23   Jaycee Greig PARAS, NP  FLUoxetine  (PROZAC ) 10 MG capsule Take 1 capsule (10 mg total) by mouth daily. 01/15/23   Jaycee Greig PARAS, NP  labetalol  (NORMODYNE ) 100 MG tablet Take 2 tablets (200 mg total) by mouth 2 (two) times daily. 01/20/24 10/08/24  Jaycee Greig PARAS, NP  pantoprazole  (PROTONIX ) 40 MG tablet Take 1 tablet (40 mg total) by  mouth daily. Patient taking differently: Take 40 mg by mouth daily as needed (reflux). 03/19/23   Jaycee Greig PARAS, NP  sucralfate  (CARAFATE ) 1 GM/10ML suspension Take 10 mLs (1 g total) by mouth 4 (four) times daily -  with meals and at bedtime. Patient taking differently: Take 1 g by mouth in the morning, at noon, and at bedtime. 01/15/24   Jaycee Greig PARAS, NP    Family History Family History  Problem Relation Age of Onset   Hypertension Mother    Hyperlipidemia Father    Hypertension Maternal Grandmother    Diabetes Paternal Grandmother    Cancer Paternal Grandmother        skin cancer melanoma   Cancer Paternal Grandfather        prostate   Cancer Other        colon and breast    Social History Social History[1]   Allergies   Patient has no known allergies.   Review of Systems Review of Systems  Constitutional: Negative.   HENT:  Positive for congestion, ear pain, sinus pressure and sinus pain. Negative for dental problem, drooling, ear discharge, facial swelling, hearing loss, mouth sores, nosebleeds, postnasal drip, rhinorrhea,  sneezing, sore throat, tinnitus, trouble swallowing and voice change.   Respiratory: Negative.    Gastrointestinal: Negative.   Neurological:  Positive for headaches. Negative for dizziness, tremors, seizures, syncope, facial asymmetry, speech difficulty, weakness, light-headedness and numbness.     Physical Exam Triage Vital Signs ED Triage Vitals  Encounter Vitals Group     BP 11/10/24 1511 (!) 149/94     Girls Systolic BP Percentile --      Girls Diastolic BP Percentile --      Boys Systolic BP Percentile --      Boys Diastolic BP Percentile --      Pulse Rate 11/10/24 1511 89     Resp 11/10/24 1511 18     Temp 11/10/24 1511 99.7 F (37.6 C)     Temp Source 11/10/24 1511 Oral     SpO2 11/10/24 1511 98 %     Weight --      Height --      Head Circumference --      Peak Flow --      Pain Score 11/10/24 1510 5     Pain Loc --       Pain Education --      Exclude from Growth Chart --    No data found.  Updated Vital Signs BP (!) 149/94 (BP Location: Right Arm)   Pulse 89   Temp 99.7 F (37.6 C) (Oral)   Resp 18   LMP 10/16/2024 (Exact Date)   SpO2 98%   Visual Acuity Right Eye Distance:   Left Eye Distance:   Bilateral Distance:    Right Eye Near:   Left Eye Near:    Bilateral Near:     Physical Exam Constitutional:      Appearance: Normal appearance.  HENT:     Right Ear: Tympanic membrane, ear canal and external ear normal.     Left Ear: Tympanic membrane, ear canal and external ear normal.     Nose: Congestion present.     Mouth/Throat:     Pharynx: Posterior oropharyngeal erythema present. No oropharyngeal exudate.  Eyes:     Extraocular Movements: Extraocular movements intact.  Cardiovascular:     Rate and Rhythm: Normal rate and regular rhythm.     Pulses: Normal pulses.     Heart sounds: Normal heart sounds.  Pulmonary:     Effort: Pulmonary effort is normal.     Breath sounds: Normal breath sounds.  Neurological:     Mental Status: She is alert and oriented to person, place, and time. Mental status is at baseline.      UC Treatments / Results  Labs (all labs ordered are listed, but only abnormal results are displayed) Labs Reviewed - No data to display  EKG   Radiology No results found.  Procedures Procedures (including critical care time)  Medications Ordered in UC Medications - No data to display  Initial Impression / Assessment and Plan / UC Course  I have reviewed the triage vital signs and the nursing notes.  Pertinent labs & imaging results that were available during my care of the patient were reviewed by me and considered in my medical decision making (see chart for details). Acute nonrecurrent frontal sinusitis  Patient is in no signs of distress nor toxic appearing.  Vital signs are stable.  Low suspicion for pneumonia, pneumothorax or bronchitis and  therefore will defer imaging.  Presentation and symptomology consistent with a sinus infection, present for 10 days initiating Augmentin  and  prednisone  for treatment.  Blurred vision most likely related to sinus pressure however has ophthalmology visit in 1 day for further evaluation of blurred vision. May use additional over-the-counter medications as needed for supportive care.  May follow-up with urgent care as needed if symptoms persist or worsen.    Final Clinical Impressions(s) / UC Diagnoses   Final diagnoses:  None   Discharge Instructions   None    ED Prescriptions   None    PDMP not reviewed this encounter.    Teresa Shelba SAUNDERS, NP 11/10/24 1552     [1]  Social History Tobacco Use   Smoking status: Never    Passive exposure: Never   Smokeless tobacco: Never  Vaping Use   Vaping status: Never Used  Substance Use Topics   Alcohol use: Yes    Alcohol/week: 2.0 - 3.0 standard drinks of alcohol    Types: 2 - 3 Glasses of wine per week   Drug use: No     Teresa Shelba SAUNDERS, NP 11/10/24 1552  "

## 2024-11-10 NOTE — Discharge Instructions (Addendum)
 Today you are being treated for a sinus infection 90 believe your blurred vision is related to the pressure behind your eye ideally will improve with use of medication  Begin Augmentin  twice daily for 7 days for treatment of bacteria  Begin prednisone  every morning with food to help reduce sinus pressure  You can take Tylenol and/or Ibuprofen as needed for fever reduction and pain relief.   For cough: honey 1/2 to 1 teaspoon (you can dilute the honey in water or another fluid).  You can also use guaifenesin and dextromethorphan for cough. You can use a humidifier for chest congestion and cough.  If you don't have a humidifier, you can sit in the bathroom with the hot shower running.      For sore throat: try warm salt water gargles, cepacol lozenges, throat spray, warm tea or water with lemon/honey, popsicles or ice, or OTC cold relief medicine for throat discomfort.   For congestion: take a daily anti-histamine like Zyrtec, Claritin, and a oral decongestant, such as pseudoephedrine.  You can also use Flonase 1-2 sprays in each nostril daily.   It is important to stay hydrated: drink plenty of fluids (water, gatorade/powerade/pedialyte, juices, or teas) to keep your throat moisturized and help further relieve irritation/discomfort.

## 2024-11-13 ENCOUNTER — Other Ambulatory Visit: Payer: Self-pay | Admitting: Family

## 2024-11-13 DIAGNOSIS — I10 Essential (primary) hypertension: Secondary | ICD-10-CM

## 2024-11-14 ENCOUNTER — Other Ambulatory Visit (HOSPITAL_COMMUNITY): Payer: Self-pay

## 2024-11-14 ENCOUNTER — Other Ambulatory Visit: Payer: Self-pay

## 2024-11-14 MED ORDER — LABETALOL HCL 100 MG PO TABS
200.0000 mg | ORAL_TABLET | Freq: Two times a day (BID) | ORAL | 2 refills | Status: AC
  Filled 2024-11-14: qty 120, 30d supply, fill #0

## 2024-11-24 ENCOUNTER — Other Ambulatory Visit (HOSPITAL_COMMUNITY): Payer: Self-pay

## 2024-11-24 ENCOUNTER — Other Ambulatory Visit: Payer: Self-pay

## 2024-11-24 MED ORDER — TRUE VITAMIN D3 1.25 MG (50000 UT) PO CAPS
50000.0000 [IU] | ORAL_CAPSULE | ORAL | 1 refills | Status: AC
  Filled 2024-11-24: qty 4, 28d supply, fill #0

## 2024-11-24 MED ORDER — THYROID 30 MG PO TABS
30.0000 mg | ORAL_TABLET | Freq: Every morning | ORAL | 0 refills | Status: AC
  Filled 2024-11-24: qty 30, 30d supply, fill #0

## 2024-11-24 MED ORDER — PROGESTERONE 200 MG PO CAPS
200.0000 mg | ORAL_CAPSULE | Freq: Every day | ORAL | 0 refills | Status: AC
  Filled 2024-11-24: qty 30, 30d supply, fill #0

## 2024-12-08 ENCOUNTER — Telehealth: Admitting: Emergency Medicine

## 2024-12-08 DIAGNOSIS — R253 Fasciculation: Secondary | ICD-10-CM

## 2024-12-08 NOTE — Progress Notes (Signed)
 " Virtual Visit Consent   Gina Mcdowell, you are scheduled for a virtual visit with a [facility] provider today. Just as with appointments in the office, your consent must be obtained to participate. Your consent will be active for this visit and any virtual visit you may have with one of our providers in the next 365 days. If you have a MyChart account, a copy of this consent can be sent to you electronically.  As this is a virtual visit, video technology does not allow for your provider to perform a traditional examination. This may limit your provider's ability to fully assess your condition. If your provider identifies any concerns that need to be evaluated in person or the need to arrange testing (such as labs, EKG, etc.), we will make arrangements to do so. Although advances in technology are sophisticated, we cannot ensure that it will always work on either your end or our end. If the connection with a video visit is poor, the visit may have to be switched to a telephone visit. With either a video or telephone visit, we are not always able to ensure that we have a secure connection.  By engaging in this virtual visit, you consent to the provision of healthcare and authorize for your insurance to be billed (if applicable) for the services provided during this visit. Depending on your insurance coverage, you may receive a charge related to this service.  I need to obtain your verbal consent now. Are you willing to proceed with your visit today? Gina Mcdowell has provided verbal consent on 12/08/2024 for a virtual visit (video or telephone). Lamar Schlossman, PA-C  Date: 12/08/2024 10:10 AM   Virtual Visit via Video Note   I, Lamar Schlossman, connected with  Gina Mcdowell  ([id number], 05-23-1986) on 12/08/24 at 10:00 AM EST by a video-enabled telemedicine application and verified that I am speaking with the correct person using two identifiers.  Location: Patient: Virtual Visit Location  Patient: Home Provider: Virtual Visit Location Provider: Home Office   I discussed the limitations of evaluation and management by telemedicine and the availability of in person appointments. The patient expressed understanding and agreed to proceed.    History of Present Illness: Gina Mcdowell is a 39 y.o. who identifies as a female who was assigned female at birth, and is being seen today for twitches in bilateral calf muscles.  States that she can see visual twitching in bilateral calves and now in her thighs today.  Denies weakness in the legs.  States that she just started vitamin D30 and thyroid  meds.  Gina Mcdowell  HPI: HPI  Problems:  Patient Active Problem List   Diagnosis Date Noted   Infertility counseling 01/15/2022   Anxiety and depression 12/16/2021   Hyperlipidemia 11/07/2021   Apneic episode 12/08/2019   Sleep disorder breathing 12/08/2019   Dysfunction of right eustachian tube 11/11/2018   Headache disorder 10/26/2018   Referred otalgia of right ear 10/26/2018   Atypical chest pain 10/05/2018   Essential hypertension 10/05/2018   GERD (gastroesophageal reflux disease) 07/28/2018   Proteinuria 04/07/2014    Allergies: Allergies[1] Medications: Current Medications[2]  Observations/Objective: Patient is well-developed, well-nourished in no acute distress.  Resting comfortably  at home.  Head is normocephalic, atraumatic.  No labored breathing.  Speech is clear and coherent with logical content.  Patient is alert and oriented at baseline.    Assessment and Plan: 1. Muscle fasciculation (Primary)   Patient presenting with worsening, bilateral muscle  twitching is calves and now thighs.  Has hx of hypocalcemia.  Will refer for lab workup.  Likely needs K, Mag, Ca checked.  Could be benign fasciculations, but would want to rule out electrolyte abnormality first.  No swelling or pain to suggest DVT.  Denies excessive stress or caffeine intake.  Follow Up Instructions: I  discussed the assessment and treatment plan with the patient. The patient was provided an opportunity to ask questions and all were answered. The patient agreed with the plan and demonstrated an understanding of the instructions.  A copy of instructions were sent to the patient via MyChart unless otherwise noted below.     The patient was advised to call back or seek an in-person evaluation if the symptoms worsen or if the condition fails to improve as anticipated.    Lamar Schlossman, PA-C     [1] No Known Allergies [2]  Current Outpatient Medications:    amoxicillin -clavulanate (AUGMENTIN ) 875-125 MG tablet, Take 1 tablet by mouth every 12 (twelve) hours., Disp: 14 tablet, Rfl: 0   atorvastatin  (LIPITOR) 20 MG tablet, Take 1 tablet by mouth once daily, Disp: 90 tablet, Rfl: 0   Cholecalciferol  (TRUE VITAMIN D3) 1.25 MG (50000 UT) capsule, Take 1 capsule (50,000 Units total) by mouth once a week., Disp: 12 capsule, Rfl: 1   FLUoxetine  (PROZAC ) 10 MG capsule, Take 1 capsule (10 mg total) by mouth daily., Disp: 30 capsule, Rfl: 2   labetalol  (NORMODYNE ) 100 MG tablet, Take 2 tablets (200 mg total) by mouth 2 (two) times daily., Disp: 120 tablet, Rfl: 2   pantoprazole  (PROTONIX ) 40 MG tablet, Take 1 tablet (40 mg total) by mouth daily. (Patient taking differently: Take 40 mg by mouth daily as needed (reflux).), Disp: 90 tablet, Rfl: 0   predniSONE  (STERAPRED UNI-PAK 21 TAB) 10 MG (21) TBPK tablet, Take 6 tabs by mouth daily  for 1 days, then 5 tabs for 1 days, then 4 tabs for 1 days, then 3 tabs for 1 days, 2 tabs for 1 days, then 1 tab by mouth daily for 1 days, Disp: 21 tablet, Rfl: 0   progesterone  (PROMETRIUM ) 200 MG capsule, Take 1 capsule (200 mg total) by mouth at bedtime., Disp: 90 capsule, Rfl: 0   sucralfate  (CARAFATE ) 1 GM/10ML suspension, Take 10 mLs (1 g total) by mouth 4 (four) times daily -  with meals and at bedtime. (Patient taking differently: Take 1 g by mouth in the morning, at  noon, and at bedtime.), Disp: 400 mL, Rfl: 2   thyroid  (NP THYROID ) 30 MG tablet, Take 1 tablet (30 mg total) by mouth in the morning., Disp: 90 tablet, Rfl: 0  "

## 2024-12-08 NOTE — Patient Instructions (Signed)
" °  Because your conditions requires further workup with labs and physical exam, I feel your condition warrants further evaluation and I recommend that you be seen in a face-to-face visit.    If you are having a true medical emergency, please call 911.     For an urgent face to face visit, [facility] has multiple urgent care centers for your convenience.  Click the link below for the full list of locations and hours, walk-in wait times, appointment scheduling options and driving directions:  Urgent Care - [place], [place], [place], [place], [place], [place]  [place]      "

## 2024-12-09 ENCOUNTER — Encounter: Payer: Self-pay | Admitting: Family

## 2025-01-16 ENCOUNTER — Encounter: Admitting: Family
